# Patient Record
Sex: Male | Born: 1985 | Race: White | Hispanic: No | Marital: Married | Smoking: Current every day smoker
Health system: Southern US, Community
[De-identification: ages and names within clinical notes are randomized; demographics above are authoritative.]

## PROBLEM LIST (undated history)

## (undated) HISTORY — PX: SKULL FRACTURE ELEVATION: SHX781

---
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
## 2006-02-05 ENCOUNTER — Emergency Department (HOSPITAL_COMMUNITY): Admission: EM | Admit: 2006-02-05 | Discharge: 2006-02-06 | Payer: Self-pay | Admitting: Emergency Medicine

## 2006-03-21 ENCOUNTER — Emergency Department (HOSPITAL_COMMUNITY): Admission: EM | Admit: 2006-03-21 | Discharge: 2006-03-21 | Payer: Self-pay | Admitting: Emergency Medicine

## 2006-04-02 ENCOUNTER — Emergency Department (HOSPITAL_COMMUNITY): Admission: EM | Admit: 2006-04-02 | Discharge: 2006-04-02 | Payer: Self-pay | Admitting: Emergency Medicine

## 2006-09-23 ENCOUNTER — Emergency Department (HOSPITAL_COMMUNITY): Admission: EM | Admit: 2006-09-23 | Discharge: 2006-09-23 | Payer: Self-pay | Admitting: Emergency Medicine

## 2008-04-07 ENCOUNTER — Emergency Department (HOSPITAL_COMMUNITY): Admission: EM | Admit: 2008-04-07 | Discharge: 2008-04-07 | Payer: Self-pay | Admitting: Emergency Medicine

## 2008-08-08 ENCOUNTER — Emergency Department (HOSPITAL_COMMUNITY): Admission: EM | Admit: 2008-08-08 | Discharge: 2008-08-08 | Payer: Self-pay | Admitting: Emergency Medicine

## 2010-03-06 ENCOUNTER — Emergency Department (HOSPITAL_COMMUNITY)
Admission: EM | Admit: 2010-03-06 | Discharge: 2010-03-06 | Payer: Self-pay | Source: Home / Self Care | Admitting: Emergency Medicine

## 2010-04-27 ENCOUNTER — Inpatient Hospital Stay (INDEPENDENT_AMBULATORY_CARE_PROVIDER_SITE_OTHER)
Admission: RE | Admit: 2010-04-27 | Discharge: 2010-04-27 | Disposition: A | Discharge status: Home / Self Care | Payer: BC Managed Care – PPO | Source: Ambulatory Visit | Attending: Family Medicine | Admitting: Family Medicine

## 2010-04-27 DIAGNOSIS — T22019A Burn of unspecified degree of unspecified forearm, initial encounter: Secondary | ICD-10-CM

## 2010-07-04 LAB — DIFFERENTIAL
Lymphocytes Relative: 5 % — ABNORMAL LOW (ref 12–46)
Lymphs Abs: 0.6 10*3/uL — ABNORMAL LOW (ref 0.7–4.0)
Monocytes Relative: 5 % (ref 3–12)
Neutrophils Relative %: 90 % — ABNORMAL HIGH (ref 43–77)

## 2010-07-04 LAB — COMPREHENSIVE METABOLIC PANEL
ALT: 20 U/L (ref 0–53)
AST: 24 U/L (ref 0–37)
CO2: 22 mEq/L (ref 19–32)
Calcium: 9.4 mg/dL (ref 8.4–10.5)
Creatinine, Ser: 0.76 mg/dL (ref 0.4–1.5)
GFR calc Af Amer: >60 mL/min (ref >60)
GFR calc non Af Amer: >60 mL/min (ref >60)
Glucose, Bld: 102 mg/dL — ABNORMAL HIGH (ref 70–99)
Sodium: 138 mEq/L (ref 135–145)
Total Protein: 6.8 g/dL (ref 6.0–8.3)

## 2010-07-04 LAB — URINALYSIS, ROUTINE W REFLEX MICROSCOPIC
Glucose, UA: NEGATIVE mg/dL
Hgb urine dipstick: NEGATIVE
pH: 6 (ref 5.0–8.0)

## 2010-07-04 LAB — CBC
MCHC: 33.5 g/dL (ref 30.0–36.0)
MCV: 92.4 fL (ref 78.0–100.0)
RBC: 5.4 MIL/uL (ref 4.22–5.81)
RDW: 13.1 % (ref 11.5–15.5)

## 2012-01-20 ENCOUNTER — Emergency Department (HOSPITAL_COMMUNITY)
Admission: EM | Admit: 2012-01-20 | Discharge: 2012-01-20 | Disposition: A | Discharge status: Home / Self Care | Payer: BC Managed Care – PPO | Attending: Emergency Medicine | Admitting: Emergency Medicine

## 2012-01-20 ENCOUNTER — Encounter (HOSPITAL_COMMUNITY): Payer: Self-pay | Admitting: *Deleted

## 2012-01-20 DIAGNOSIS — F17200 Nicotine dependence, unspecified, uncomplicated: Secondary | ICD-10-CM | POA: Insufficient documentation

## 2012-01-20 DIAGNOSIS — J029 Acute pharyngitis, unspecified: Secondary | ICD-10-CM | POA: Insufficient documentation

## 2012-01-20 LAB — RAPID STREP SCREEN (MED CTR MEBANE ONLY): Streptococcus, Group A Screen (Direct): NEGATIVE

## 2012-01-20 MED ORDER — LIDOCAINE HCL (CARDIAC) 20 MG/ML IV SOLN
INTRAVENOUS | Status: AC
  Filled 2012-01-20: qty 5

## 2012-01-20 MED ORDER — SUCCINYLCHOLINE CHLORIDE 20 MG/ML IJ SOLN
INTRAMUSCULAR | Status: AC
  Filled 2012-01-20: qty 1

## 2012-01-20 MED ORDER — ETOMIDATE 2 MG/ML IV SOLN
INTRAVENOUS | Status: AC
  Filled 2012-01-20: qty 20

## 2012-01-20 MED ORDER — ROCURONIUM BROMIDE 50 MG/5ML IV SOLN
INTRAVENOUS | Status: AC
  Filled 2012-01-20: qty 2

## 2012-01-20 NOTE — ED Provider Notes (Signed)
History     CSN: [id number]  Arrival date & time 01/20/12  1021   First MD Initiated Contact with Patient 01/20/12 1031      Chief Complaint  Patient presents with  . sorethroat     (Consider location/radiation/quality/duration/timing/severity/associated sxs/prior treatment) Patient is a 26 y.o. male presenting with pharyngitis. The history is provided by the patient.  Sore Throat This is a new problem. The current episode started today. The problem occurs constantly. The problem has been gradually worsening. Associated symptoms include a sore throat and swollen glands. Pertinent negatives include no abdominal pain, fever, nausea, rash or vomiting. The symptoms are aggravated by swallowing. He has tried nothing for the symptoms.    History reviewed. No pertinent past medical history.  History reviewed. No pertinent past surgical history.  No family history on file.  History  Substance Use Topics  . Smoking status: Current Every Day Smoker  . Smokeless tobacco: Not on file  . Alcohol Use: Yes     occ      Review of Systems  Constitutional: Negative for fever.  HENT: Positive for sore throat.   Gastrointestinal: Negative for nausea, vomiting and abdominal pain.  Skin: Negative for rash.    Allergies  Review of patient's allergies indicates no known allergies.  Home Medications   Current Outpatient Rx  Name Route Sig Dispense Refill  . GUAIFENESIN-DM 100-10 MG/5ML PO SYRP Oral Take 10 mLs by mouth 3 (three) times daily as needed. For runny nose and cough    . IBUPROFEN 200 MG PO TABS Oral Take 600 mg by mouth every 6 (six) hours as needed. For pain      BP 153/90  Pulse 109  Temp 97.9 F (36.6 C) (Oral)  Resp 22  Ht 5\' 11"  (1.803 m)  Wt 215 lb (97.523 kg)  BMI 29.99 kg/m2  SpO2 98%  Physical Exam  Nursing note and vitals reviewed. Constitutional: He appears well-developed and well-nourished. No distress.  HENT:  Head: Normocephalic and atraumatic. No  trismus in the jaw.  Right Ear: Tympanic membrane and ear canal normal.  Left Ear: Tympanic membrane and ear canal normal.  Nose: Mucosal edema and rhinorrhea present. Right sinus exhibits no maxillary sinus tenderness and no frontal sinus tenderness. Left sinus exhibits no maxillary sinus tenderness and no frontal sinus tenderness.  Mouth/Throat: Uvula is midline and mucous membranes are normal. Posterior oropharyngeal edema and posterior oropharyngeal erythema present. No tonsillar abscesses.  Cardiovascular: Normal rate, regular rhythm and normal heart sounds.   Pulmonary/Chest: Effort normal and breath sounds normal.  Skin: He is not diaphoretic.    ED Course  Procedures (including critical care time)   Labs Reviewed  RAPID STREP SCREEN   No results found.   No diagnosis found.    MDM  Patient presenting with sore throat.  Onset this morning.  Uvula midline.  Patient able to swallow and handling secretions well.  Rapid strep negative.  Patient discharged home.  Return precautions discussed.          Pascal Lux Leando, PA-C 01/20/12 [phone]

## 2012-01-20 NOTE — ED Notes (Signed)
Pt here with sorethroat that started this am and reports stuffiness in nose for last couple of days.

## 2012-01-20 NOTE — ED Notes (Signed)
Lab aware of need to add on strep culture

## 2012-01-21 LAB — STREP A DNA PROBE
Group A Strep Probe: NEGATIVE
Special Requests: NORMAL

## 2012-01-22 NOTE — ED Provider Notes (Signed)
Medical screening examination/treatment/procedure(s) were performed by non-physician practitioner and as supervising physician I was immediately available for consultation/collaboration.   Suzi Roots, MD 01/22/12 [phone]

## 2012-09-30 ENCOUNTER — Emergency Department (HOSPITAL_COMMUNITY)
Admission: EM | Admit: 2012-09-30 | Discharge: 2012-09-30 | Disposition: A | Discharge status: Home / Self Care | Payer: BC Managed Care – PPO | Attending: Emergency Medicine | Admitting: Emergency Medicine

## 2012-09-30 ENCOUNTER — Encounter (HOSPITAL_COMMUNITY): Payer: Self-pay | Admitting: *Deleted

## 2012-09-30 DIAGNOSIS — T6391XA Toxic effect of contact with unspecified venomous animal, accidental (unintentional), initial encounter: Secondary | ICD-10-CM | POA: Insufficient documentation

## 2012-09-30 DIAGNOSIS — T63461A Toxic effect of venom of wasps, accidental (unintentional), initial encounter: Secondary | ICD-10-CM | POA: Insufficient documentation

## 2012-09-30 DIAGNOSIS — Y92838 Other recreation area as the place of occurrence of the external cause: Secondary | ICD-10-CM | POA: Insufficient documentation

## 2012-09-30 DIAGNOSIS — S40862A Insect bite (nonvenomous) of left upper arm, initial encounter: Secondary | ICD-10-CM

## 2012-09-30 DIAGNOSIS — W57XXXA Bitten or stung by nonvenomous insect and other nonvenomous arthropods, initial encounter: Secondary | ICD-10-CM

## 2012-09-30 DIAGNOSIS — Y9239 Other specified sports and athletic area as the place of occurrence of the external cause: Secondary | ICD-10-CM | POA: Insufficient documentation

## 2012-09-30 DIAGNOSIS — L089 Local infection of the skin and subcutaneous tissue, unspecified: Secondary | ICD-10-CM

## 2012-09-30 DIAGNOSIS — F17200 Nicotine dependence, unspecified, uncomplicated: Secondary | ICD-10-CM | POA: Insufficient documentation

## 2012-09-30 DIAGNOSIS — Y939 Activity, unspecified: Secondary | ICD-10-CM | POA: Insufficient documentation

## 2012-09-30 DIAGNOSIS — S40269A Insect bite (nonvenomous) of unspecified shoulder, initial encounter: Secondary | ICD-10-CM | POA: Insufficient documentation

## 2012-09-30 MED ORDER — SULFAMETHOXAZOLE-TRIMETHOPRIM 800-160 MG PO TABS: 1.0000 | ORAL_TABLET | Freq: Two times a day (BID) | ORAL | Status: AC

## 2012-09-30 MED ORDER — SULFAMETHOXAZOLE-TMP DS 800-160 MG PO TABS
1.0000 | ORAL_TABLET | Freq: Once | ORAL | Status: AC
  Administered 2012-09-30: 1 via ORAL
  Filled 2012-09-30: qty 1

## 2012-09-30 NOTE — ED Provider Notes (Signed)
History    CSN: [id number] Arrival date & time 09/30/12  3086  First MD Initiated Contact with Patient 09/30/12 2032     Chief Complaint  Patient presents with  . Insect Bite   (Consider location/radiation/quality/duration/timing/severity/associated sxs/prior Treatment) HPI Comments: Francis Clay is a 27 y.o. Male presenting with an insect sting to his right lower leg which [place].  He reports there being a bee hive near and believes he was stung by a bee but did not see the insect.  He has increased pain,  Swelling and redness that has spread into his medial ankle and foot.  He reports there was a "blood blister" at the site of the sting which has burst but has not continued to bleed or drain.  He has had no fevers, chills, nausea or abdominal pain and denies headaches,  Rash, myalgias.  Incidentally he found a tick on his left upper arm while sitting in our waiting room.  He has taken ibuprofen with mild improvement in foot pain and also elevated it last night with no improvement in swelling and pain.     The history is provided by the patient and the spouse.   History reviewed. No pertinent past medical history. History reviewed. No pertinent past surgical history. History reviewed. No pertinent family history. History  Substance Use Topics  . Smoking status: Current Every Day Smoker  . Smokeless tobacco: Not on file  . Alcohol Use: Yes     Comment: occ    Review of Systems  Constitutional: Negative for fever and chills.  HENT: Negative for facial swelling.   Respiratory: Negative for shortness of breath and wheezing.   Skin: Positive for color change and wound.  Neurological: Negative for numbness.    Allergies  Review of patient's allergies indicates no known allergies.  Home Medications   Current Outpatient Rx  Name  Route  Sig  Dispense  Refill  . ibuprofen (ADVIL,MOTRIN) 200 MG tablet   Oral   Take 600  mg by mouth every 6 (six) hours as needed. For pain          BP 141/98  Pulse 84  Temp(Src) 98.4 F (36.9 C) (Oral)  Resp 24  Ht 5\' 11"  (1.803 m)  Wt 215 lb (97.523 kg)  BMI 30 kg/m2  SpO2 100% Physical Exam  Constitutional: He appears well-developed and well-nourished. No distress.  HENT:  Head: Normocephalic.  Neck: Neck supple.  Cardiovascular: Normal rate.   Pulmonary/Chest: Effort normal. He has no wheezes.  Musculoskeletal: Normal range of motion. He exhibits no edema.  Skin: There is erythema.  Small ulceration right medial lower leg with no surrounding edema,  Necrosis, red streaking , drainage or fluctuance.  Mild erythema extending to instep of the right foot,  Subtly across dorsal foot.  No proximal edema, redness or red streaking.   Embedded tick left lateral upper arm which is not yet engorged.    ED Course  FOREIGN BODY REMOVAL Date/Time: 09/30/2012 9:00 PM Performed by: Burgess Amor Authorized by: Burgess Amor Consent: Verbal consent obtained. Risks and benefits: risks, benefits and alternatives were discussed Consent given by: patient Patient identity confirmed: verbally with patient Body area: skin General location: upper extremity Location details: left upper arm Localization method: visualized Removal mechanism: forceps Complexity: simple 1 objects recovered. Objects recovered: tick Post-procedure assessment: foreign body removed Patient tolerance: Patient tolerated the procedure well with no immediate complications.   (  including critical care time)    Labs Reviewed - No data to display No results found. 1. Infected insect bite of ankle, right, initial encounter   2. Tick bite of upper arm, left, initial encounter     MDM  Pt states would be unable to afford doxy.  No sx suggestive of tick infection,  The insect involved with the ankle pain was a sting, stating he was standing near a bee hive when the sting occurred.  Bactrim given for probable  early cellulitis.  He was also encouraged to ice and elevate,  Benadryl for swelling as there is also component of localized reaction as well. Recheck by pcp or return here for worsened sx.   Burgess Amor, PA-C 10/01/12 0230

## 2012-09-30 NOTE — ED Notes (Signed)
Insect bite to rt ankle  And noticed a tick on his lt upper arm after arrival to ER.  Swelling to lt  ankle

## 2012-10-01 NOTE — ED Provider Notes (Signed)
Medical screening examination/treatment/procedure(s) were performed by non-physician practitioner and as supervising physician I was immediately available for consultation/collaboration.   Pebbles Zeiders L Alorah Mcree, MD 10/01/12 2327 

## 2015-03-18 ENCOUNTER — Emergency Department (HOSPITAL_COMMUNITY)
Admission: EM | Admit: 2015-03-18 | Discharge: 2015-03-18 | Disposition: A | Discharge status: Home / Self Care | Payer: Self-pay | Attending: Emergency Medicine | Admitting: Emergency Medicine

## 2015-03-18 ENCOUNTER — Encounter (HOSPITAL_COMMUNITY): Payer: Self-pay | Admitting: *Deleted

## 2015-03-18 DIAGNOSIS — R51 Headache: Secondary | ICD-10-CM | POA: Insufficient documentation

## 2015-03-18 DIAGNOSIS — J020 Streptococcal pharyngitis: Secondary | ICD-10-CM | POA: Insufficient documentation

## 2015-03-18 DIAGNOSIS — F17200 Nicotine dependence, unspecified, uncomplicated: Secondary | ICD-10-CM | POA: Insufficient documentation

## 2015-03-18 LAB — RAPID STREP SCREEN (MED CTR MEBANE ONLY): STREPTOCOCCUS, GROUP A SCREEN (DIRECT): POSITIVE — AB

## 2015-03-18 MED ORDER — IBUPROFEN 100 MG/5ML PO SUSP
400.0000 mg | Freq: Once | ORAL | Status: AC
  Administered 2015-03-18: 400 mg via ORAL
  Filled 2015-03-18: qty 20

## 2015-03-18 MED ORDER — HYDROCODONE-ACETAMINOPHEN 5-325 MG PO TABS: 1.0000 | ORAL_TABLET | ORAL | Status: DC | PRN

## 2015-03-18 MED ORDER — ONDANSETRON 4 MG PO TBDP
4.0000 mg | ORAL_TABLET | Freq: Once | ORAL | Status: AC
  Administered 2015-03-18: 4 mg via ORAL
  Filled 2015-03-18: qty 1

## 2015-03-18 MED ORDER — PENICILLIN G BENZATHINE 1200000 UNIT/2ML IM SUSP
1.2000 10*6.[IU] | Freq: Once | INTRAMUSCULAR | Status: AC
  Administered 2015-03-18: 1.2 10*6.[IU] via INTRAMUSCULAR
  Filled 2015-03-18: qty 2

## 2015-03-18 NOTE — ED Notes (Signed)
Pt c/o sore throat since last night. 

## 2015-03-18 NOTE — Discharge Instructions (Signed)
Your strep test is positive. Please use a mask over the next 4-5 days. It is important that wash her hands frequently, and do not share eating utensils with anyone. You were treated in the emergency department with intramuscular penicillin (Bicillin). Please use extra strength Tylenol, or 600 mg of ibuprofen for mild pain, use Norco for more severe pain. Saltwater gargles maybe helpful as well. Strep Throat Strep throat is an infection of the throat. It is caused by germs. Strep throat spreads from person to person because of coughing, sneezing, or close contact. HOME CARE Medicines  Take over-the-counter and prescription medicines only as told by your doctor.  Take your antibiotic medicine as told by your doctor. Do not stop taking the medicine even if you feel better.  Have family members who also have a sore throat or fever go to a doctor. Eating and Drinking  Do not share food, drinking cups, or personal items.  Try eating soft foods until your sore throat feels better.  Drink enough fluid to keep your pee (urine) clear or pale yellow. General Instructions  Rinse your mouth (gargle) with a salt-water mixture 3-4 times per day or as needed. To make a salt-water mixture, stir -1 tsp of salt into 1 cup of warm water.  Make sure that all people in your house wash their hands well.  Rest.  Stay home from school or work until you have been taking antibiotics for 24 hours.  Keep all follow-up visits as told by your doctor. This is important. GET HELP IF:  Your neck keeps getting bigger.  You get a rash, cough, or earache.  You cough up thick liquid that is green, yellow-brown, or bloody.  You have pain that does not get better with medicine.  Your problems get worse instead of getting better.  You have a fever. GET HELP RIGHT AWAY IF:  You throw up (vomit).  You get a very bad headache.  You neck hurts or it feels stiff.  You have chest pain or you are short of  breath.  You have drooling, very bad throat pain, or changes in your voice.  Your neck is swollen or the skin gets red and tender.  Your mouth is dry or you are peeing less than normal.  You keep feeling more tired or it is hard to wake up.  Your joints are red or they hurt.   This information is not intended to replace advice given to you by your health care provider. Make sure you discuss any questions you have with your health care provider.   Document Released: 08/23/2007 Document Revised: 11/25/2014 Document Reviewed: 06/29/2014 Elsevier Interactive Patient Education [employer]2016 [employer]vier Inc.

## 2015-03-18 NOTE — ED Provider Notes (Signed)
CSN: [id number]     Arrival date & time 03/18/15  1926 History   First MD Initiated Contact with Patient 03/18/15 2151     Chief Complaint  Patient presents with  . Sore Throat     (Consider location/radiation/quality/duration/timing/severity/associated sxs/prior Treatment) Patient is a 29 y.o. male presenting with pharyngitis. The history is provided by the patient.  Sore Throat This is a new problem. The current episode started yesterday. The problem occurs constantly. The problem has been gradually worsening. Associated symptoms include a fever, headaches, myalgias and a sore throat. Pertinent negatives include no rash. The symptoms are aggravated by swallowing. He has tried nothing for the symptoms. The treatment provided no relief.    History reviewed. No pertinent past medical history. History reviewed. No pertinent past surgical history. History reviewed. No pertinent family history. Social History  Substance Use Topics  . Smoking status: Current Every Day Smoker  . Smokeless tobacco: None  . Alcohol Use: Yes     Comment: occ    Review of Systems  Constitutional: Positive for fever.  HENT: Positive for sore throat.   Musculoskeletal: Positive for myalgias.  Skin: Negative for rash.  Neurological: Positive for headaches.  All other systems reviewed and are negative.     Allergies  Review of patient's allergies indicates no known allergies.  Home Medications   Prior to Admission medications   Medication Sig Start Date End Date Taking? Authorizing Provider  ibuprofen (ADVIL,MOTRIN) 200 MG tablet Take 600 mg by mouth every 6 (six) hours as needed. For pain   Yes Historical Provider, MD  loratadine (ALLERGY) 10 MG tablet Take 10 mg by mouth daily as needed for allergies.   Yes Historical Provider, MD   BP 154/101 mmHg  Pulse 98  Temp(Src) 100.1 F (37.8 C)  Resp 20  Ht 5\' 11"  (1.803 m)  Wt 113.399 kg  BMI 34.88 kg/m2  SpO2 98% Physical Exam  Constitutional:  He is oriented to person, place, and time. He appears well-developed and well-nourished.  Non-toxic appearance.  HENT:  Head: Normocephalic.  Right Ear: Tympanic membrane and external ear normal.  Left Ear: Tympanic membrane and external ear normal.  Mouth/Throat: Uvula swelling present. Oropharyngeal exudate and posterior oropharyngeal erythema present.  Eyes: EOM and lids are normal. Pupils are equal, round, and reactive to light.  Neck: Normal range of motion. Neck supple. Carotid bruit is not present.  Cardiovascular: Normal rate, regular rhythm, normal heart sounds, intact distal pulses and normal pulses.   Pulmonary/Chest: Breath sounds normal. No respiratory distress.  Abdominal: Soft. Bowel sounds are normal. There is no tenderness. There is no guarding.  Musculoskeletal: Normal range of motion.  Lymphadenopathy:       Head (right side): No submandibular adenopathy present.       Head (left side): No submandibular adenopathy present.    He has no cervical adenopathy.  Neurological: He is alert and oriented to person, place, and time. He has normal strength. No cranial nerve deficit or sensory deficit.  Skin: Skin is warm and dry. No rash noted.  Psychiatric: He has a normal mood and affect. His speech is normal.  Nursing note and vitals reviewed.   ED Course  Procedures (including critical care time) Labs Review Labs Reviewed  RAPID STREP SCREEN (NOT AT [facility]) - Abnormal; Notable for the following:    Streptococcus, Group A Screen (Direct) POSITIVE (*)    All other components within normal limits    Imaging Review No results found. I  have personally reviewed and evaluated these images and lab results as part of my medical decision-making.   EKG Interpretation None      MDM  Vital signs reviewed. Temperature 100.1, blood pressure 154/101 elevated. Pulse oximetry is 98% on room air. Rapid strep is positive. Discussed treatment options with the patient, he elects to have  the Bicillin injection.  Discussed with the patient the need to use his mask and wash hands frequently. Discussed the contagious nature of this illness. Patient is to use salt water gargles. Tylenol or ibuprofen for mild pain, prescription for Norco given for more severe pain. Patient is in agreement with this discharge plan.    Final diagnoses:  Strep pharyngitis    **I have reviewed nursing notes, vital signs, and all appropriate lab and imaging results for this patient.Ivery Quale, PA-C 03/19/15 1552  Bethann Berkshire, MD 03/22/15 [phone]

## 2017-06-22 ENCOUNTER — Emergency Department (HOSPITAL_COMMUNITY): Payer: 59

## 2017-06-22 ENCOUNTER — Encounter (HOSPITAL_COMMUNITY): Payer: Self-pay

## 2017-06-22 ENCOUNTER — Emergency Department (HOSPITAL_COMMUNITY)
Admission: EM | Admit: 2017-06-22 | Discharge: 2017-06-22 | Disposition: A | Discharge status: Home / Self Care | Payer: 59 | Attending: Emergency Medicine | Admitting: Emergency Medicine

## 2017-06-22 DIAGNOSIS — Z79899 Other long term (current) drug therapy: Secondary | ICD-10-CM | POA: Insufficient documentation

## 2017-06-22 DIAGNOSIS — R0789 Other chest pain: Secondary | ICD-10-CM

## 2017-06-22 DIAGNOSIS — F17210 Nicotine dependence, cigarettes, uncomplicated: Secondary | ICD-10-CM | POA: Insufficient documentation

## 2017-06-22 MED ORDER — TRAMADOL HCL 50 MG PO TABS: 50.0000 mg | ORAL_TABLET | Freq: Two times a day (BID) | ORAL | 0 refills | Status: DC | PRN

## 2017-06-22 NOTE — ED Notes (Signed)
Pain with palpation left lower ant. Rib area. Reports increase pain in mid back

## 2017-06-22 NOTE — Discharge Instructions (Addendum)
Your chest x-ray did not show any signs of rib fracture or punctured lung. Alternate 600 mg of ibuprofen and [phone] mg of Tylenol every 3 hours as needed for pain. Do not exceed 4000 mg of Tylenol daily.  You may take tramadol as needed for severe pain but do not drive, drink alcohol, or operate heavy machinery while taking this medication as it may make you drowsy.  Use the incentive spirometer 2-3 times daily and cut back on smoking to avoid development of a pneumonia.  Return to the emergency department if any concerning signs or symptoms develop such as fever, productive cough, worsening shortness of breath or chest pain.  Follow-up with your primary care physician for reevaluation of your symptoms.

## 2017-06-22 NOTE — ED Triage Notes (Signed)
Pt reports that he slipped in the tub and landed across tub . Reports pain to left rib area and pain in mid back. Hurts to cough and deep breath

## 2017-06-22 NOTE — ED Provider Notes (Signed)
[facility] EMERGENCY DEPARTMENT Provider Note   CSN: [id number] Arrival date & time: 06/22/17  1002     History   Chief Complaint Chief Complaint  Patient presents with  . Rib Injury    HPI Francis Clay is a 32 y.o. male presents today for evaluation of acute onset, waxing and waning chest wall pain secondary to injury yesterday.  Patient states that yesterday evening he slipped in his bathtub after a shower in the left side of his chest struck the edge of the bathtub.  He denies head injury or loss of consciousness.  He notes a dull ache at rest with sharp pain with cough and certain movements as well as deep inspiration.  He states the pain is focal anteriorly and posteriorly.  He feels short of breath with increasing pain.  He does smoke 2-3 cigars daily and uses a vapor pen.  He notes occasional marijuana use as well.  He denies fevers or productive cough.  The history is provided by the patient.    History reviewed. No pertinent past medical history.  There are no active problems to display for this patient.   History reviewed. No pertinent surgical history.      Home Medications    Prior to Admission medications   Medication Sig Start Date End Date Taking? Authorizing Provider  HYDROcodone-acetaminophen (NORCO/VICODIN) 5-325 MG tablet Take 1 tablet by mouth every 4 (four) hours as needed. Patient not taking: Reported on 06/22/2017 03/18/15   Ivery QualeBryant, [place], PA-C  traMADol (ULTRAM) 50 MG tablet Take 1 tablet (50 mg total) by mouth every 12 (twelve) hours as needed for severe pain. 06/22/17   Jeanie SewerFawze, Emmaly Leech A, PA-C    Family History No family history on file.  Social History Social History   Tobacco Use  . Smoking status: Current Every Day Smoker    Types: E-cigarettes  . Tobacco comment: pt vapes  Substance Use Topics  . Alcohol use: Yes    Comment: daily  . Drug use: Yes    Types: Marijuana     Allergies   Patient has no known allergies.   Review of  Systems Review of Systems  Constitutional: Negative for chills and fever.  Respiratory: Positive for shortness of breath.   Cardiovascular: Positive for chest pain (chest wall).  Gastrointestinal: Negative for abdominal pain, nausea and vomiting.  Neurological: Negative for syncope, weakness, numbness and headaches.  All other systems reviewed and are negative.    Physical Exam Updated Vital Signs BP (!) 139/98 (BP Location: Right Arm)   Pulse 78   Temp 98.4 F (36.9 C) (Oral)   Resp 18   Wt 97.5 kg (215 lb)   SpO2 99%   BMI 29.99 kg/m   Physical Exam  Constitutional: He is oriented to person, place, and time. He appears well-developed and well-nourished. No distress.  HENT:  Head: Normocephalic and atraumatic.  Eyes: Pupils are equal, round, and reactive to light. Conjunctivae and EOM are normal. Right eye exhibits no discharge. Left eye exhibits no discharge.  Neck: Normal range of motion. Neck supple. No JVD present. No tracheal deviation present.  No midline spine TTP, no paraspinal muscle tenderness, no deformity, crepitus, or step-off noted   Cardiovascular: Normal rate, regular rhythm, normal heart sounds and intact distal pulses.  Pulmonary/Chest: Effort normal. No stridor. No respiratory distress. He has no wheezes. He has no rales. He exhibits tenderness.  Focal tenderness to palpation of the chest wall anterior laterally on the left side  and posteriorly along the midclavicular line.  No crepitus, ecchymosis, flail segment or paradoxical wall motion noted.  There is equal rise and fall of chest although the patient is hesitant to take deep breaths secondary to pain.  No increased work of breathing noted and he is speaking in full sentences without difficulty.  Breath sounds are clear to auscultation bilaterally.    Abdominal: Soft. Bowel sounds are normal. He exhibits no distension. There is no tenderness.  Musculoskeletal: Normal range of motion. He exhibits no edema.         Arms: No midline spine tenderness to palpation, parathoracic bony tenderness as documented in the pulmonary section.  No deformity, crepitus, or step-off noted.  Neurological: He is alert and oriented to person, place, and time. No cranial nerve deficit or sensory deficit. He exhibits normal muscle tone.  Skin: Skin is warm and dry. No erythema.  Psychiatric: He has a normal mood and affect. His behavior is normal.  Nursing note and vitals reviewed.    ED Treatments / Results  Labs (all labs ordered are listed, but only abnormal results are displayed) Labs Reviewed - No data to display  EKG None  Radiology Dg Chest 2 View  Result Date: 06/22/2017 CLINICAL DATA:  Pain following fall EXAM: CHEST - 2 VIEW COMPARISON:  September 23, 2006 FINDINGS: Lungs are clear. Heart size and pulmonary vascularity are normal. No adenopathy. No pneumothorax. No fracture evident. IMPRESSION: No abnormality noted. Electronically Signed   By: Bretta Bang III M.D.   On: 06/22/2017 11:16    Procedures Procedures (including critical care time)  Medications Ordered in ED Medications - No data to display   Initial Impression / Assessment and Plan / ED Course  I have reviewed the triage vital signs and the nursing notes.  Pertinent labs & imaging results that were available during my care of the patient were reviewed by me and considered in my medical decision making (see chart for details).     Patient presents with chest wall pain reproducible on palpation secondary to mechanical injury yesterday.  He is afebrile, vital signs are stable.  No head injury or loss of consciousness.  Symptoms do not appear to be cardiac in etiology.  Lungs are clear to auscultation bilaterally although patient is hesitant to take deep breaths secondary to pain.  I doubt pneumothorax/hemothorax.  No flail chest on examination.  Radiographs of the chest show no evidence of acute cardiopulmonary abnormality or rib  fracture.  No evidence of hemothorax or pneumothorax on imaging.  However with significant pain and significant smoking history, we will discharge with an incentive spirometer and a small amount of pain medicine for breakthrough pain.  Discussed pain control with ibuprofen and Tylenol.  Recommend follow-up with primary care physician for reevaluation of symptoms.  Discussed indications for return to the ED. Pt verbalized understanding of and agreement with plan and is safe for discharge home at this time.  Final Clinical Impressions(s) / ED Diagnoses   Final diagnoses:  Chest wall pain    ED Discharge Orders        Ordered    traMADol (ULTRAM) 50 MG tablet  Every 12 hours PRN     06/22/17 1131       Jeanie Sewer, PA-C 06/22/17 1226    Raeford Razor, MD 06/25/17 [phone]

## 2018-09-28 ENCOUNTER — Encounter (HOSPITAL_COMMUNITY): Payer: Self-pay | Admitting: Emergency Medicine

## 2018-09-28 ENCOUNTER — Other Ambulatory Visit: Payer: Self-pay

## 2018-09-28 ENCOUNTER — Emergency Department (HOSPITAL_COMMUNITY)
Admission: EM | Admit: 2018-09-28 | Discharge: 2018-09-28 | Disposition: A | Discharge status: Home / Self Care | Payer: 59 | Attending: Emergency Medicine | Admitting: Emergency Medicine

## 2018-09-28 DIAGNOSIS — Z202 Contact with and (suspected) exposure to infections with a predominantly sexual mode of transmission: Secondary | ICD-10-CM | POA: Insufficient documentation

## 2018-09-28 DIAGNOSIS — F17290 Nicotine dependence, other tobacco product, uncomplicated: Secondary | ICD-10-CM | POA: Insufficient documentation

## 2018-09-28 NOTE — ED Provider Notes (Signed)
[facility] Provider Note   CSN: [id number] Arrival date & time: 09/28/18  1115     History   Chief Complaint No chief complaint on file.   HPI Francis Clay is a 33 y.o. male.     Pt reports his wife told him she had exposed him to herpes.  Pt request std evaluation. Pt denies any current symptoms.  No lesions no discharge.  The history is provided by the patient. No language interpreter was used.  Exposure to STD This is a new problem. The current episode started more than 1 week ago. The problem occurs constantly. The problem has not changed since onset.Nothing aggravates the symptoms. Nothing relieves the symptoms. He has tried nothing for the symptoms. The treatment provided no relief.    History reviewed. No pertinent past medical history.  There are no active problems to display for this patient.   Past Surgical History:  Procedure Laterality Date  . SKULL FRACTURE ELEVATION     when pt was kid         Home Medications    Prior to Admission medications   Medication Sig Start Date End Date Taking? Authorizing Provider  HYDROcodone-acetaminophen (NORCO/VICODIN) 5-325 MG tablet Take 1 tablet by mouth every 4 (four) hours as needed. Patient not taking: Reported on 09/28/2018 03/18/15   Lily Kocher, PA-C  traMADol (ULTRAM) 50 MG tablet Take 1 tablet (50 mg total) by mouth every 12 (twelve) hours as needed for severe pain. Patient not taking: Reported on 09/28/2018 06/22/17   Renita Papa, PA-C    Family History No family history on file.  Social History Social History   Tobacco Use  . Smoking status: Current Every Day Smoker    Types: Cigars  . Smokeless tobacco: Never Used  Substance Use Topics  . Alcohol use: Yes    Comment: daily  . Drug use: Yes    Types: Marijuana    Comment: occ     Allergies   Patient has no known allergies.   Review of Systems Review of Systems  All other systems reviewed and are negative.     Physical Exam Updated Vital Signs BP (!) 187/104 (BP Location: Right Arm)   Pulse (!) 108   Temp 98.4 F (36.9 C) (Oral)   Resp 18   Ht 5\' 11"  (1.803 m)   Wt 97.5 kg   SpO2 100%   BMI 29.99 kg/m   Physical Exam Vitals signs and nursing note reviewed.  Constitutional:      Appearance: He is well-developed.  HENT:     Head: Normocephalic.  Neck:     Musculoskeletal: Normal range of motion.  Cardiovascular:     Rate and Rhythm: Normal rate.  Pulmonary:     Effort: Pulmonary effort is normal.  Abdominal:     General: There is no distension.  Musculoskeletal: Normal range of motion.  Neurological:     General: No focal deficit present.     Mental Status: He is alert and oriented to person, place, and time.  Psychiatric:        Mood and Affect: Mood normal.      ED Treatments / Results  Labs (all labs ordered are listed, but only abnormal results are displayed) Labs Reviewed  HIV ANTIBODY (ROUTINE TESTING W REFLEX)  RPR  GC/CHLAMYDIA PROBE AMP ([place]) NOT AT [facility]    EKG None  Radiology No results found.  Procedures Procedures (including critical care time)  Medications  Ordered in ED Medications - No data to display   Initial Impression / Assessment and Plan / ED Course  I have reviewed the triage vital signs and the nursing notes.  Pertinent labs & imaging results that were available during my care of the patient were reviewed by me and considered in my medical decision making (see chart for details).        Labs pending.  Pt counseled on safe sex.   Final Clinical Impressions(s) / ED Diagnoses   Final diagnoses:  STD exposure    ED Discharge Orders    None    An After Visit Summary was printed and given to the patient.    Elson AreasSofia, Khala Tarte K, Cordelia Poche-C 09/28/18 1904    Vanetta MuldersZackowski, Scott, MD 09/29/18 [phone]

## 2018-09-28 NOTE — Discharge Instructions (Signed)
You have test pending for gonorrhea, Chlamydia,  Syphilis and HIV

## 2018-09-28 NOTE — ED Triage Notes (Signed)
Wife just found out she has genital herpes.  Pt reports she cheated on him and wants to be tested.  Denies any s/s.

## 2018-09-29 LAB — RPR: RPR Ser Ql: NONREACTIVE

## 2018-09-29 LAB — HIV ANTIBODY (ROUTINE TESTING W REFLEX): HIV Screen 4th Generation wRfx: NONREACTIVE

## 2018-10-01 LAB — GC/CHLAMYDIA PROBE AMP (~~LOC~~) NOT AT ARMC
Chlamydia: NEGATIVE
Neisseria Gonorrhea: NEGATIVE

## 2019-09-26 ENCOUNTER — Encounter (HOSPITAL_COMMUNITY): Payer: Self-pay

## 2019-09-26 ENCOUNTER — Other Ambulatory Visit: Payer: Self-pay

## 2019-09-26 ENCOUNTER — Emergency Department (HOSPITAL_COMMUNITY)
Admission: EM | Admit: 2019-09-26 | Discharge: 2019-09-26 | Disposition: A | Discharge status: Home / Self Care | Payer: Self-pay | Attending: Emergency Medicine | Admitting: Emergency Medicine

## 2019-09-26 DIAGNOSIS — Z5321 Procedure and treatment not carried out due to patient leaving prior to being seen by health care provider: Secondary | ICD-10-CM | POA: Insufficient documentation

## 2019-09-26 DIAGNOSIS — H5789 Other specified disorders of eye and adnexa: Secondary | ICD-10-CM | POA: Insufficient documentation

## 2019-09-26 DIAGNOSIS — H5712 Ocular pain, left eye: Secondary | ICD-10-CM | POA: Insufficient documentation

## 2019-09-26 NOTE — ED Triage Notes (Signed)
Pt to er, pt states that he woke up yesterday morning and his L eye was swollen and the sclera was red.  Pt reports that his vision is normal, states that the light makes the pain worse, doesn't feel like anything is in his eye.

## 2021-06-05 ENCOUNTER — Emergency Department (HOSPITAL_COMMUNITY)
Admission: EM | Admit: 2021-06-05 | Discharge: 2021-06-05 | Disposition: A | Discharge status: Home / Self Care | Payer: BC Managed Care – PPO | Attending: Emergency Medicine | Admitting: Emergency Medicine

## 2021-06-05 ENCOUNTER — Emergency Department (HOSPITAL_COMMUNITY): Payer: BC Managed Care – PPO

## 2021-06-05 ENCOUNTER — Encounter (HOSPITAL_COMMUNITY): Payer: Self-pay | Admitting: Emergency Medicine

## 2021-06-05 DIAGNOSIS — W06XXXA Fall from bed, initial encounter: Secondary | ICD-10-CM | POA: Diagnosis not present

## 2021-06-05 DIAGNOSIS — S42021A Displaced fracture of shaft of right clavicle, initial encounter for closed fracture: Secondary | ICD-10-CM | POA: Insufficient documentation

## 2021-06-05 DIAGNOSIS — S4991XA Unspecified injury of right shoulder and upper arm, initial encounter: Secondary | ICD-10-CM | POA: Diagnosis not present

## 2021-06-05 MED ORDER — HYDROCODONE-ACETAMINOPHEN 5-325 MG PO TABS
1.0000 | ORAL_TABLET | Freq: Four times a day (QID) | ORAL | 0 refills | Status: DC | PRN
Start: 2021-06-05 — End: 2021-06-08

## 2021-06-05 MED ORDER — IBUPROFEN 400 MG PO TABS
400.0000 mg | ORAL_TABLET | Freq: Once | ORAL | Status: AC
  Administered 2021-06-05: 400 mg via ORAL
  Filled 2021-06-05: qty 1

## 2021-06-05 MED ORDER — HYDROCODONE-ACETAMINOPHEN 5-325 MG PO TABS
1.0000 | ORAL_TABLET | Freq: Once | ORAL | Status: AC
  Administered 2021-06-05: 1 via ORAL
  Filled 2021-06-05: qty 1

## 2021-06-05 NOTE — ED Triage Notes (Signed)
Pt fell off side of bed about 20 mins PTA. Pt landed on right shoulder. Pt with possible deformity to right collar bone.  ?

## 2021-06-05 NOTE — ED Provider Notes (Signed)
?[facility] ?Provider Note ? ? ?CSN: [id number] ?Arrival date & time: 06/05/21  0306 ? ?  ? ?History ? ?Chief Complaint  ?Patient presents with  ? Shoulder Injury  ? ? ?Francis Clay is a 36 y.o. male. ? ?The history is provided by the patient and a significant other.  ?Shoulder Injury ?This is a new problem. The current episode started less than 1 hour ago. The problem occurs constantly. The problem has been gradually worsening. Pertinent negatives include no chest pain and no headaches. Exacerbated by: movement. The symptoms are relieved by rest.  ?Patient presents with right shoulder pain and right clavicle pain ?Patient reports he rolled out of bed landing on his back and had pain in his clavicle.  No head injury.  No neck pain.  No other acute complaints except for small abrasion to his left little toe ? ?Home Medications ?Prior to Admission medications   ?Medication Sig Start Date End Date Taking? Authorizing Provider  ?HYDROcodone-acetaminophen (NORCO/VICODIN) 5-325 MG tablet Take 1 tablet by mouth every 6 (six) hours as needed for severe pain. 06/05/21  Yes Ripley Fraise, MD  ?   ? ?Allergies    ?Patient has no known allergies.   ? ?Review of Systems   ?Review of Systems  ?Cardiovascular:  Negative for chest pain.  ?Musculoskeletal:  Positive for arthralgias.  ?Skin:  Positive for wound.  ?Neurological:  Negative for weakness, numbness and headaches.  ? ?Physical Exam ?Updated Vital Signs ?BP 131/90 (BP Location: Left Arm)   Pulse 99   Temp 98.1 ?F (36.7 ?C) (Oral)   Resp 18   Ht 1.803 m ($Remove'5\' 11"'dYmBsbc$ )   Wt 99.8 kg   SpO2 97%   BMI 30.68 kg/m?  ?Physical Exam ?CONSTITUTIONAL: Disheveled and anxious ?HEAD: Normocephalic/atraumatic ?ENMT: Mucous membranes moist ?NECK: supple no meningeal signs ?SPINE/BACK:entire spine nontender, nexus criteria met ?NEURO: Pt is awake/alert/appropriate, moves all extremitiesx4.  No facial droop.  GCS 15 ?EXTREMITIES: pulses normal/equal, full  ROM ?Tenderness over the right clavicle with deformity.  No tenderness noted right shoulder.  Distal pulses intact.  No tenderness noted to right elbow or right wrist.  Patient is able to range the right shoulder but limited due to pain. ?SKIN: warm, color normal ?PSYCH: Anxious ? ?ED Results / Procedures / Treatments   ?Labs ?(all labs ordered are listed, but only abnormal results are displayed) ?Labs Reviewed - No data to display ? ?EKG ?None ? ?Radiology ?DG Shoulder Right ? ?Result Date: 06/05/2021 ?CLINICAL DATA:  36 year old male status post fall landing on right shoulder with collarbone deformity. EXAM: RIGHT SHOULDER - 2+ VIEW COMPARISON:  Chest radiographs 06/22/2017. FINDINGS: Comminuted right mid clavicular shaft fracture with an steeply inferiorly angulated 2.4 cm butterfly fragment, larger but more mildly angulated 4.2 cm superior butterfly fragment. But no significant distal fragment displacement or angulation. Right AC joint appears to remain intact. No glenohumeral joint dislocation. Proximal right humerus and right scapula appear intact. Negative visible right ribs and chest. IMPRESSION: Comminuted right clavicle midshaft fracture with displaced, angulated 2 cm and 4 cm butterfly fragments. Electronically Signed   By: Genevie Ann M.D.   On: 06/05/2021 04:47   ? ?Procedures ?[facility] KitchenOrtho Injury Treatment ? ?Date/Time: 06/05/2021 5:15 AM ?Performed by: Ripley Fraise, MD ?Authorized by: Ripley Fraise, MD  ? ?Consent:  ?  Consent obtained:  Verbal ?  Consent given by:  PatientInjury location: sternoclavicular ?Location details: right clavicle ?Injury type: fracture ?Pre-procedure neurovascular assessment: neurovascularly intact ?Pre-procedure distal perfusion:  normal ?Pre-procedure neurological function: normal ?Pre-procedure range of motion: reduced ?Immobilization: sling ?Splint Applied by: ED Nurse ?Post-procedure neurovascular assessment: post-procedure neurovascularly intact ?Post-procedure distal  perfusion: normal ?Post-procedure neurological function: normal ?Post-procedure range of motion: unchanged ? ?  ? ? ?Medications Ordered in ED ?Medications  ?HYDROcodone-acetaminophen (NORCO/VICODIN) 5-325 MG per tablet 1 tablet (has no administration in time range)  ?ibuprofen (ADVIL) tablet 400 mg (400 mg Oral Given 06/05/21 0435)  ? ? ?ED Course/ Medical Decision Making/ A&P ?  ?            NEXUS Criteria Score: 0 ?           ?Medical Decision Making ?Amount and/or Complexity of Data Reviewed ?Radiology: ordered. ? ?Risk ?Prescription drug management. ? ? ?Patient presents after falling out of bed & injuring his right clavicle.  Patient has comminuted right clavicle fracture.  There is no signs of any fracture dislocation to the right shoulder.  No other signs of acute traumatic injury. ?Patient presented with acute injury that received treatment and is now appropriate for discharge home. ?Sling was applied.  Short course of pain medicine provided.  Will refer to Ortho and he will need close follow-up so he can plan his work schedule ? ? ? ? ? ? ? ?Final Clinical Impression(s) / ED Diagnoses ?Final diagnoses:  ?Closed displaced fracture of shaft of right clavicle, initial encounter  ? ? ?Rx / DC Orders ?ED Discharge Orders   ? ?      Ordered  ?  HYDROcodone-acetaminophen (NORCO/VICODIN) 5-325 MG tablet  Every 6 hours PRN       ? 06/05/21 0513  ? ?  ?  ? ?  ? ? ?  ?Ripley Fraise, MD ?06/05/21 [phone] ? ?

## 2021-06-08 ENCOUNTER — Other Ambulatory Visit: Payer: Self-pay

## 2021-06-08 ENCOUNTER — Encounter: Payer: Self-pay | Admitting: Orthopedic Surgery

## 2021-06-08 ENCOUNTER — Ambulatory Visit (INDEPENDENT_AMBULATORY_CARE_PROVIDER_SITE_OTHER): Payer: BC Managed Care – PPO | Admitting: Orthopedic Surgery

## 2021-06-08 ENCOUNTER — Ambulatory Visit: Payer: PRIVATE HEALTH INSURANCE

## 2021-06-08 VITALS — Ht 71.0 in | Wt 224.0 lb

## 2021-06-08 DIAGNOSIS — S42021A Displaced fracture of shaft of right clavicle, initial encounter for closed fracture: Secondary | ICD-10-CM

## 2021-06-08 MED ORDER — HYDROCODONE-ACETAMINOPHEN 5-325 MG PO TABS: 1.0000 | ORAL_TABLET | Freq: Four times a day (QID) | ORAL | 0 refills | Status: DC | PRN

## 2021-06-08 MED ORDER — CYCLOBENZAPRINE HCL 10 MG PO TABS: 10.0000 mg | ORAL_TABLET | Freq: Two times a day (BID) | ORAL | 0 refills | Status: DC | PRN

## 2021-06-08 NOTE — Patient Instructions (Signed)
Out of work until next visit - please provide a note 

## 2021-06-09 ENCOUNTER — Encounter: Payer: Self-pay | Admitting: Orthopedic Surgery

## 2021-06-09 NOTE — Progress Notes (Addendum)
New Patient Visit ? ?Assessment: ?Francis Clay is a 36 y.o. male with the following: ?1. Closed displaced fracture of shaft of right clavicle, initial encounter ? ? ?Plan: ?Francis Clay has a comminuted, minimally displaced fracture of the right clavicle.  After thorough discussion regarding risks and benefits of both operative and nonoperative management of this injury, he would like to try nonoperative management.  As a result, I recommend close follow-up.  I would like to see him back in clinic in approximately 1 week for repeat evaluation.  Continue with the sling at all times.  Okay to remove for hygiene.  Continue with medications as needed.  Provided a letter for work. ? ? ?Follow-up: ?Return in about 9 days (around 06/17/2021). ? ?Subjective: ? ?Chief Complaint  ?Patient presents with  ? Fracture  ?  Rt clavicle DOI 06/05/21  ? ? ?History of Present Illness: ?Francis Clay is a 36 y.o. male who presents for evaluation of a right shoulder injury.  Patient states that he fell out of bed, early in the morning prior to going to the emergency department.  X-rays in the emergency department demonstrated a fracture of the clavicle shaft.  He is placed in a sling.  He was given pain medications.  His pain has been controlled.  No numbness or tingling.  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 ? ? ?Review of Systems: ?No fevers or chills ?No numbness or tingling ?No chest pain ?No shortness of breath ?No bowel or bladder dysfunction ?No GI distress ?No headaches ? ? ?Medical History: ? ?No past medical history on file. ? ?Past Surgical History:  ?Procedure Laterality Date  ? SKULL FRACTURE ELEVATION    ? when pt was kid   ? ? ?No family history on file. ?Social History  ? ?Tobacco Use  ? Smoking status: Every Day  ?  Types: Cigars  ? Smokeless tobacco: Never  ?Vaping Use  ? Vaping Use: Former  ?Substance Use Topics  ? Alcohol use: Yes  ? Drug use: Not Currently  ? ? ?No Known  Allergies ? ?Current Meds  ?Medication Sig  ? cyclobenzaprine (FLEXERIL) 10 MG tablet Take 1 tablet (10 mg total) by mouth 2 (two) times daily as needed for muscle spasms.  ? HYDROcodone-acetaminophen (NORCO/VICODIN) 5-325 MG tablet Take 1 tablet by mouth every 6 (six) hours as needed for moderate pain.  ? ? ?Objective: ?Ht 5\' 11"  (1.803 m)   Wt 224 lb (101.6 kg)   BMI 31.24 kg/m?  ? ?Physical Exam: ? ?General: Alert and oriented. and No acute distress. ?Gait: Normal gait. ? ?Right shoulder with bruising and swelling over the midshaft clavicle.  Tenderness to palpation.  Range of motion of the shoulder is deferred.  Sensation is intact over the axillary nerve distribution.  Sensation is intact throughout the right hand.  2+ radial pulse. ? ?IMAGING: ?I personally ordered and reviewed the following images ? ? ?X-rays of the right clavicle were obtained in clinic today.  He has a comminuted, displaced clavicle shaft fracture.  There is minimal shortening.  Shoulder is reduced.  No injury to the [facility] joint. ? ?Impression: Right comminuted clavicle shaft fracture, in acceptable alignment ? ?New Medications:  ?Meds ordered this encounter  ?Medications  ? HYDROcodone-acetaminophen (NORCO/VICODIN) 5-325 MG tablet  ?  Sig: Take 1 tablet by mouth every 6 (six) hours as needed for moderate pain.  ?  Dispense:  20 tablet  ?  Refill:  0  ? cyclobenzaprine (FLEXERIL) 10 MG tablet  ?  Sig: Take 1 tablet (10 mg total) by mouth 2 (two) times daily as needed for muscle spasms.  ?  Dispense:  15 tablet  ?  Refill:  0  ? ? ? ? ?Mordecai Rasmussen, MD ? ?06/09/2021 ?12:03 PM ? ? ?

## 2021-06-17 ENCOUNTER — Ambulatory Visit: Payer: BC Managed Care – PPO

## 2021-06-17 ENCOUNTER — Encounter: Payer: Self-pay | Admitting: Orthopedic Surgery

## 2021-06-17 ENCOUNTER — Ambulatory Visit (INDEPENDENT_AMBULATORY_CARE_PROVIDER_SITE_OTHER): Payer: BC Managed Care – PPO | Admitting: Orthopedic Surgery

## 2021-06-17 DIAGNOSIS — S42021D Displaced fracture of shaft of right clavicle, subsequent encounter for fracture with routine healing: Secondary | ICD-10-CM

## 2021-06-17 MED ORDER — HYDROCODONE-ACETAMINOPHEN 5-325 MG PO TABS: 1.0000 | ORAL_TABLET | Freq: Four times a day (QID) | ORAL | 0 refills | Status: DC | PRN

## 2021-06-17 MED ORDER — CYCLOBENZAPRINE HCL 10 MG PO TABS
10.0000 mg | ORAL_TABLET | Freq: Two times a day (BID) | ORAL | 0 refills | Status: DC | PRN
Start: 2021-06-17 — End: 2022-01-12

## 2021-06-17 NOTE — Patient Instructions (Signed)
Letter for work, out until the next visit  

## 2021-06-17 NOTE — Progress Notes (Addendum)
Return Patient Visit ? ?Assessment: ?Francis Clay is a 36 y.o. male with the following: ?1. Closed displaced fracture of shaft of right clavicle ? ?Plan: ?Francis Clay returns for evaluation of his right clavicle.  Radiographs demonstrate stable alignment.  His pain is improving.  He is continuing to take pain medications, as well as Flexeril.  As long as he is requiring narcotics, he is unable to drive.  Okay to come out of the sling for hygiene, as well as gentle range of motion of the elbow, wrist and hand.  Provided refills on his medications.  Continue nonoperative management.  Follow-up in approximately 2 weeks. ? ? ?Follow-up: ?No follow-ups on file. ? ?Subjective: ? ?Chief Complaint  ?Patient presents with  ? fracture care  ?  RT clavicle ?Closed displaced fracture of shaft of right clavicle ?DOI 06/05/21  ? ? ?History of Present Illness: ?Francis Clay is a 36 y.o. male who returns for evaluation of a right shoulder injury.  Overall, his pain is improving.  He does continue to take narcotics, as well as Flexeril.  He has remained in a sling.  He is noted some swelling in his fingers.  No additional injuries, although he woke himself up recently with sudden movement of his arm, which is painful. ? ?Review of Systems: ?No fevers or chills ?No numbness or tingling ?No chest pain ?No shortness of breath ?No bowel or bladder dysfunction ?No GI distress ?No headaches ? ?Objective: ?There were no vitals taken for this visit. ? ?Physical Exam: ? ?General: Alert and oriented. and No acute distress. ?Gait: Normal gait. ? ?Improved swelling and bruising over the right clavicle.  Tenderness to palpation over the midshaft of the clavicle.  No obvious deformity.  Sensation is intact in the axillary nerve distribution.  Fingers are warm and well-perfused.  Mild swelling of the right hand.  Active motion intact in the AIN/PIN/U nerve distribution.  Sensation is intact throughout the hand. ? ?IMAGING: ?I personally  ordered and reviewed the following images ? ? ?X-rays of the right clavicle were obtained in clinic today.  He has a comminuted, displaced midshaft fracture.  Overall alignment remains unchanged compared to prior x-rays.  AC joint is reduced.  Glenohumeral joint is reduced. ? ?Impression: Right clavicle fracture in stable alignment ? ? ?New Medications:  ?Meds ordered this encounter  ?Medications  ? cyclobenzaprine (FLEXERIL) 10 MG tablet  ?  Sig: Take 1 tablet (10 mg total) by mouth 2 (two) times daily as needed for muscle spasms.  ?  Dispense:  15 tablet  ?  Refill:  0  ? HYDROcodone-acetaminophen (NORCO/VICODIN) 5-325 MG tablet  ?  Sig: Take 1 tablet by mouth every 6 (six) hours as needed for moderate pain.  ?  Dispense:  20 tablet  ?  Refill:  0  ? ? ? ? ?Oliver Barre, MD ? ?06/17/2021 ?8:42 AM ? ? ?

## 2021-07-05 ENCOUNTER — Encounter: Payer: Self-pay | Admitting: Orthopedic Surgery

## 2021-07-05 ENCOUNTER — Ambulatory Visit: Payer: BC Managed Care – PPO

## 2021-07-05 ENCOUNTER — Ambulatory Visit (INDEPENDENT_AMBULATORY_CARE_PROVIDER_SITE_OTHER): Payer: BC Managed Care – PPO | Admitting: Orthopedic Surgery

## 2021-07-05 DIAGNOSIS — S42021D Displaced fracture of shaft of right clavicle, subsequent encounter for fracture with routine healing: Secondary | ICD-10-CM

## 2021-07-05 NOTE — Patient Instructions (Addendum)
Ok to return to work in a week, 07/11/21; please provide a letter ? ? ?Pendulum ? ? ?Stand near a wall or a surface that you can hold onto for balance. ?Bend at the waist and let your left / right arm hang straight down. Use your other arm to support you. Keep your back straight and do not lock your knees. ?Relax your left / right arm and shoulder muscles, and move your hips and your trunk so your left / right arm swings freely. Your arm should swing because of the motion of your body, not because you are using your arm or shoulder muscles. ?Keep moving your hips and trunk so your arm swings in the following directions, as told by your health care provider: ?Side to side. ?Forward and backward. ?In clockwise and counterclockwise circles. ?Continue each motion for 20 seconds, or for as long as told by your health care provider. ?Slowly return to the starting position. ? ?Repeat 10 times. Complete this exercise daily. ? ? ? ?Limited motion for the next 2 weeks.  After that, ok to progress to motion up to shoulder level, gradual increase above shoulder level.  No lifting until next visit.  Ok to remove sling.  Increase activity slowly.   ?

## 2021-07-05 NOTE — Progress Notes (Signed)
Return Patient Visit ? ?Assessment: ?Francis Clay is a 36 y.o. male with the following: ?1. Closed displaced fracture of shaft of right clavicle ? ?Plan: ?Francis Clay returns for evaluation of his right clavicle.  Radiographs are stable.  His pain is improving.  He is no longer taking any pain medications.  He has started to work on some gentle range of motion.  At this point, okay for him to increase his overall activity.  He is to take it slow.  Okay to come out of the sling.  He should start with pendulum exercises, in the next 1-2 weeks, can start working on range of motion to the level of his shoulder.  In 2 weeks, he can start to advance his range of motion above the level of the shoulder.  He should not be lifting yet.  Okay to return to work starting next week.  Follow-up in 4 weeks. ? ? ?Follow-up: ?Return in about 4 weeks (around 08/02/2021). ? ?Subjective: ? ?Chief Complaint  ?Patient presents with  ? fracture care  ?  Closed displaced fracture of shaft of RIGHT clavicle ?DOI 06/05/21  ? ? ?History of Present Illness: ?Francis Clay is a 36 y.o. male who returns for evaluation of a right shoulder injury.  He continues to improve.  He is no longer taking narcotic pain medications.  He tolerates some gentle range of motion of the left shoulder.  He does note some crepitus in the shoulder area.  He does have some tenderness over the anterior shoulder.  He is taking ibuprofen occasionally.  No numbness or tingling. ? ? ? ?Review of Systems: ?No fevers or chills ?No numbness or tingling ?No chest pain ?No shortness of breath ?No bowel or bladder dysfunction ?No GI distress ?No headaches ? ?Objective: ?There were no vitals taken for this visit. ? ?Physical Exam: ? ?General: Alert and oriented. and No acute distress. ?Gait: Normal gait. ? ?Tenderness over the mid clavicle shaft.  Minimal swelling.  Mild deformity.  Tolerates 60 degrees of abduction at his side.  75 degrees of forward flexion.  Some crepitus is  appreciated with forward flexion.  Fingers are warm and well-perfused.  Sensation is intact throughout the hand.  2+ radial pulse. ? ? ?IMAGING: ?I personally ordered and reviewed the following images ? ?X-rays of the right clavicle were obtained in clinic today.  Is a comminuted, midshaft clavicle fracture.  This is unchanged from previous radiographs.  No evidence of callus formation.  No acute injuries are noted.  AC joint is reduced. ? ?Impression: Healing comminuted, midshaft right clavicle fracture. ? ? ?New Medications:  ?No orders of the defined types were placed in this encounter. ? ? ? ? ?Mordecai Rasmussen, MD ? ?07/05/2021 ?11:46 AM ? ? ?

## 2021-08-05 ENCOUNTER — Encounter: Payer: Self-pay | Admitting: Orthopedic Surgery

## 2021-08-05 ENCOUNTER — Ambulatory Visit (INDEPENDENT_AMBULATORY_CARE_PROVIDER_SITE_OTHER): Payer: BC Managed Care – PPO

## 2021-08-05 ENCOUNTER — Ambulatory Visit (INDEPENDENT_AMBULATORY_CARE_PROVIDER_SITE_OTHER): Payer: BC Managed Care – PPO | Admitting: Orthopedic Surgery

## 2021-08-05 VITALS — Ht 71.0 in | Wt 224.0 lb

## 2021-08-05 DIAGNOSIS — S42021D Displaced fracture of shaft of right clavicle, subsequent encounter for fracture with routine healing: Secondary | ICD-10-CM

## 2021-08-06 ENCOUNTER — Encounter: Payer: Self-pay | Admitting: Orthopedic Surgery

## 2021-08-06 NOTE — Progress Notes (Signed)
Return Patient Visit  Assessment: Francis Clay is a 36 y.o. male with the following: 1. Closed displaced fracture of shaft of right clavicle  Plan: [name] is doing much better.  He has minimal pain.  Radiographs are stable, demonstrating interval consolidation of the comminuted right clavicle fracture.  He has near full range of motion of the right shoulder.  He does have some weakness, but anticipate this will continue to improve.  Gradual increase in his activities.  Be cognizant of how much he is lifting.  Medications as needed.  Call with issues.   Follow-up: Return if symptoms worsen or fail to improve.  Subjective:  Chief Complaint  Patient presents with   Fracture    Rt clavicle DOI 06/05/21    History of Present Illness: Francis Clay is a 36 y.o. male who returns for evaluation of a right shoulder injury.  He sustained a comminuted right clavicle fracture approximately 2 months ago.  He has progressively improved.  He is no longer taking medications on a consistent basis.  He has regained range of motion.  He notes some popping and pain with certain activities, but this is getting better.  No numbness or tingling.  He is pleased with his progress.    Review of Systems: No fevers or chills No numbness or tingling No chest pain No shortness of breath No bowel or bladder dysfunction No GI distress No headaches  Objective: Ht 5\' 11"  (1.803 m)   Wt 224 lb (101.6 kg)   BMI 31.24 kg/m   Physical Exam:  General: Alert and oriented. and No acute distress. Gait: Normal gait.  Deformity appreciated over the mid right clavicle.  No tenderness to palpation.  160 degrees of active forward elevation.  Internal rotation of the lumbar spine.  5/5 deltoid strength.  4+/5 supraspinatus and infraspinatus testing.  Sensation of the right hand is intact in all nerve distributions.  2+ radial pulse.  IMAGING: I personally ordered and reviewed the following images  X-rays  of the right clavicle were obtained in clinic today.  These were compared to prior x-rays.  The comminuted midshaft clavicle fracture is still easily identified.  There is obvious callus formation in this area.  No new injuries are noted.  Overall alignment remains stable.  Impression: Healing right comminuted midshaft clavicle fracture   New Medications:  No orders of the defined types were placed in this encounter.     , MD  08/06/2021 12:08 AM

## 2021-10-03 DIAGNOSIS — R0681 Apnea, not elsewhere classified: Secondary | ICD-10-CM | POA: Diagnosis not present

## 2021-10-04 DIAGNOSIS — R0681 Apnea, not elsewhere classified: Secondary | ICD-10-CM | POA: Diagnosis not present

## 2021-10-06 DIAGNOSIS — G4733 Obstructive sleep apnea (adult) (pediatric): Secondary | ICD-10-CM | POA: Diagnosis not present

## 2021-10-20 DIAGNOSIS — G4733 Obstructive sleep apnea (adult) (pediatric): Secondary | ICD-10-CM | POA: Diagnosis not present

## 2021-10-31 DIAGNOSIS — M5412 Radiculopathy, cervical region: Secondary | ICD-10-CM | POA: Diagnosis not present

## 2021-11-30 DIAGNOSIS — G4733 Obstructive sleep apnea (adult) (pediatric): Secondary | ICD-10-CM | POA: Diagnosis not present

## 2021-12-01 DIAGNOSIS — G4733 Obstructive sleep apnea (adult) (pediatric): Secondary | ICD-10-CM | POA: Diagnosis not present

## 2022-01-12 ENCOUNTER — Encounter: Payer: Self-pay | Admitting: Internal Medicine

## 2022-01-12 ENCOUNTER — Ambulatory Visit: Payer: BC Managed Care – PPO | Admitting: Internal Medicine

## 2022-01-12 VITALS — BP 142/92 | HR 96 | Ht 71.0 in | Wt 218.2 lb

## 2022-01-12 DIAGNOSIS — Z0001 Encounter for general adult medical examination with abnormal findings: Secondary | ICD-10-CM

## 2022-01-12 DIAGNOSIS — Z789 Other specified health status: Secondary | ICD-10-CM

## 2022-01-12 DIAGNOSIS — Z2821 Immunization not carried out because of patient refusal: Secondary | ICD-10-CM | POA: Diagnosis not present

## 2022-01-12 DIAGNOSIS — Z7689 Persons encountering health services in other specified circumstances: Secondary | ICD-10-CM

## 2022-01-12 DIAGNOSIS — I10 Essential (primary) hypertension: Secondary | ICD-10-CM | POA: Diagnosis not present

## 2022-01-12 DIAGNOSIS — Z720 Tobacco use: Secondary | ICD-10-CM

## 2022-01-12 DIAGNOSIS — G4733 Obstructive sleep apnea (adult) (pediatric): Secondary | ICD-10-CM | POA: Diagnosis not present

## 2022-01-12 MED ORDER — LISINOPRIL 10 MG PO TABS: 10.0000 mg | ORAL_TABLET | Freq: Every day | ORAL | 1 refills | Status: DC

## 2022-01-12 NOTE — Patient Instructions (Signed)
It was a pleasure to see you today.  Thank you for giving [place] the opportunity to be involved in your care.  Below is a brief recap of your visit and next steps.  We will plan to see you again in 4 weeks.  Summary You have established care I have refilled lisinopril We will check labs You will receive your tetanus shot today  Next steps Follow up in 4 weeks.

## 2022-01-12 NOTE — Progress Notes (Signed)
New Patient Office Visit  Subjective    Patient ID: Francis Clay, male    DOB: March 24, 1985  Age: 36 y.o. MRN: [id number]  CC:  Chief Complaint  Patient presents with   Establish Care    HPI Francis Clay presents to establish care.  He is a 36 year old male who reports a history of hypertension and OSA.  He also has a recent history of a right clavicular fracture.  Francis Clay states that he feels well today.  He has no acute concerns aside from wanting to establish care and requesting a refill of lisinopril 10 mg daily for treatment of hypertension.  He currently smokes 3-4 black and mild cigars daily and drinks 2-3 beers daily.  He denies illicit drug use.  He endorses a family history of hypertension.  Chronic medical conditions and outstanding preventative care items discussed today are individually addressed in A/P below  Outpatient Encounter Medications as of 01/12/2022  Medication Sig   [DISCONTINUED] lisinopril (ZESTRIL) 10 MG tablet Take 10 mg by mouth daily.   lisinopril (ZESTRIL) 10 MG tablet Take 1 tablet (10 mg total) by mouth daily.   [DISCONTINUED] cyclobenzaprine (FLEXERIL) 10 MG tablet Take 1 tablet (10 mg total) by mouth 2 (two) times daily as needed for muscle spasms. (Patient not taking: Reported on 08/05/2021)   [DISCONTINUED] HYDROcodone-acetaminophen (NORCO/VICODIN) 5-325 MG tablet Take 1 tablet by mouth every 6 (six) hours as needed for moderate pain. (Patient not taking: Reported on 08/05/2021)   No facility-administered encounter medications on file as of 01/12/2022.    History reviewed. No pertinent past medical history.  Past Surgical History:  Procedure Laterality Date   SKULL FRACTURE ELEVATION     when pt was kid     History reviewed. No pertinent family history.  Social History   Socioeconomic History   Marital status: Married    Spouse name: Not on file   Number of children: Not on file   Years of education: Not on file   Highest education  level: Not on file  Occupational History   Not on file  Tobacco Use   Smoking status: Every Day    Types: Cigars   Smokeless tobacco: Never  Vaping Use   Vaping Use: Former  Substance and Sexual Activity   Alcohol use: Yes   Drug use: Not Currently   Sexual activity: Not on file  Other Topics Concern   Not on file  Social History Narrative   Not on file   Social Determinants of Health   Financial Resource Strain: Not on file  Food Insecurity: Not on file  Transportation Needs: Not on file  Physical Activity: Not on file  Stress: Not on file  Social Connections: Not on file  Intimate Partner Violence: Not on file    Review of Systems  Constitutional:  Negative for chills and fever.  HENT:  Negative for sore throat.   Respiratory:  Negative for cough and shortness of breath.   Cardiovascular:  Negative for chest pain, palpitations and leg swelling.  Gastrointestinal:  Negative for abdominal pain, blood in stool, constipation, diarrhea, nausea and vomiting.  Genitourinary:  Negative for dysuria and hematuria.  Musculoskeletal:  Negative for myalgias.  Skin:  Negative for itching and rash.  Neurological:  Negative for dizziness and headaches.  Psychiatric/Behavioral:  Negative for depression and suicidal ideas.         Objective    BP (!) 142/92   Pulse 96   Ht 5'  11" (1.803 m)   Wt 218 lb 3.2 oz (99 kg)   SpO2 96%   BMI 30.43 kg/m   Physical Exam Vitals reviewed.  Constitutional:      General: He is not in acute distress.    Appearance: Normal appearance. He is obese. He is not ill-appearing.  HENT:     Head: Normocephalic and atraumatic.     Nose: Nose normal. No congestion or rhinorrhea.     Mouth/Throat:     Mouth: Mucous membranes are moist.     Pharynx: Oropharynx is clear.  Eyes:     Extraocular Movements: Extraocular movements intact.     Conjunctiva/sclera: Conjunctivae normal.     Pupils: Pupils are equal, round, and reactive to light.   Cardiovascular:     Rate and Rhythm: Normal rate and regular rhythm.     Pulses: Normal pulses.     Heart sounds: Normal heart sounds. No murmur heard. Pulmonary:     Effort: Pulmonary effort is normal.     Breath sounds: Normal breath sounds. No wheezing, rhonchi or rales.  Abdominal:     General: Abdomen is flat. Bowel sounds are normal. There is no distension.     Palpations: Abdomen is soft.     Tenderness: There is no abdominal tenderness.  Musculoskeletal:        General: No swelling or deformity. Normal range of motion.     Cervical back: Normal range of motion.  Skin:    General: Skin is warm and dry.     Capillary Refill: Capillary refill takes less than 2 seconds.  Neurological:     General: No focal deficit present.     Mental Status: He is alert and oriented to person, place, and time.     Motor: No weakness.  Psychiatric:        Mood and Affect: Mood normal.        Behavior: Behavior normal.        Thought Content: Thought content normal.    Assessment & Plan:   Problem List Items Addressed This Visit       Essential hypertension    BP 149/92 today.  He has previously been prescribed lisinopril 10 mg daily, but states that he has been out of the medication for the last week.  Refill provided today.  We will follow-up in 4 weeks for BP check.      OSA (obstructive sleep apnea)    He reports a history of OSA and states he wears a mouthguard at night, which is effective for him.  He reports undergoing repeat PSG with mouthguard in place and the rate of apneic events had significantly improved.      Encounter for well adult exam with abnormal findings    Presenting today to establish care. -Baseline labs ordered, including one-time HCV screening -Influenza vaccine declined today, but he plans to receive Tdap at follow-up in 4 weeks      Tobacco use    Currently smokes 3-4 black and mild cigars daily.  He remains precontemplative with regards to cessation.       Alcohol use    Currently drinking 2-3 beers daily.  He is not interested in cessation.      Return in about 4 weeks (around 02/09/2022) for BP check.   Francis Abraham, MD

## 2022-01-18 ENCOUNTER — Encounter: Payer: Self-pay | Admitting: Internal Medicine

## 2022-01-18 DIAGNOSIS — G4733 Obstructive sleep apnea (adult) (pediatric): Secondary | ICD-10-CM | POA: Insufficient documentation

## 2022-01-18 DIAGNOSIS — Z0001 Encounter for general adult medical examination with abnormal findings: Secondary | ICD-10-CM | POA: Insufficient documentation

## 2022-01-18 DIAGNOSIS — Z720 Tobacco use: Secondary | ICD-10-CM | POA: Insufficient documentation

## 2022-01-18 DIAGNOSIS — I10 Essential (primary) hypertension: Secondary | ICD-10-CM | POA: Insufficient documentation

## 2022-01-18 DIAGNOSIS — Z789 Other specified health status: Secondary | ICD-10-CM | POA: Insufficient documentation

## 2022-01-18 NOTE — Assessment & Plan Note (Signed)
Currently drinking 2-3 beers daily.  He is not interested in cessation.

## 2022-01-18 NOTE — Assessment & Plan Note (Signed)
Currently smokes 3-4 black and mild cigars daily.  He remains precontemplative with regards to cessation.

## 2022-01-18 NOTE — Assessment & Plan Note (Signed)
He reports a history of OSA and states he wears a mouthguard at night, which is effective for him.  He reports undergoing repeat PSG with mouthguard in place and the rate of apneic events had significantly improved.

## 2022-01-18 NOTE — Assessment & Plan Note (Signed)
BP 149/92 today.  He has previously been prescribed lisinopril 10 mg daily, but states that he has been out of the medication for the last week.  Refill provided today.  We will follow-up in 4 weeks for BP check.

## 2022-01-18 NOTE — Assessment & Plan Note (Signed)
Presenting today to establish care. -Baseline labs ordered, including one-time HCV screening -Influenza vaccine declined today, but he plans to receive Tdap at follow-up in 4 weeks

## 2022-02-08 ENCOUNTER — Encounter: Payer: Self-pay | Admitting: Internal Medicine

## 2022-02-08 ENCOUNTER — Ambulatory Visit: Payer: BC Managed Care – PPO | Admitting: Internal Medicine

## 2022-02-08 VITALS — BP 142/93 | HR 91 | Ht 71.0 in | Wt 221.4 lb

## 2022-02-08 DIAGNOSIS — Z23 Encounter for immunization: Secondary | ICD-10-CM

## 2022-02-08 DIAGNOSIS — I10 Essential (primary) hypertension: Secondary | ICD-10-CM | POA: Diagnosis not present

## 2022-02-08 DIAGNOSIS — Z7689 Persons encountering health services in other specified circumstances: Secondary | ICD-10-CM | POA: Diagnosis not present

## 2022-02-08 MED ORDER — LISINOPRIL 20 MG PO TABS: 20.0000 mg | ORAL_TABLET | Freq: Every day | ORAL | 1 refills | Status: DC

## 2022-02-08 NOTE — Assessment & Plan Note (Signed)
-   Tdap vaccine administered today.

## 2022-02-08 NOTE — Progress Notes (Signed)
Established Patient Office Visit  Subjective   Patient ID: Francis Clay, male    DOB: 09/26/1985  Age: 36 y.o. MRN: [id number]  Chief Complaint  Patient presents with   Follow-up   [name] returns to care today for HTN follow-up.  He was last seen by me on 10/26 to establish care.  He endorsed a history of hypertension and OSA at that time.  His blood pressure was elevated, 149/92.  I refilled lisinopril 10 mg daily that has been previously prescribed.  4-week follow-up was arranged for BP check.  There have been no acute interval events.  Today [name] states that he feels well.  He is asymptomatic and has no additional concerns to discuss.  History reviewed. No pertinent past medical history. Past Surgical History:  Procedure Laterality Date   SKULL FRACTURE ELEVATION     when pt was kid    Social History   Tobacco Use   Smoking status: Every Day    Types: Cigars   Smokeless tobacco: Never  Vaping Use   Vaping Use: Former  Substance Use Topics   Alcohol use: Yes   Drug use: Not Currently   History reviewed. No pertinent family history. No Known Allergies  Review of Systems  Constitutional:  Negative for chills and fever.  HENT:  Negative for sore throat.   Respiratory:  Negative for cough and shortness of breath.   Cardiovascular:  Negative for chest pain, palpitations and leg swelling.  Gastrointestinal:  Negative for abdominal pain, blood in stool, constipation, diarrhea, nausea and vomiting.  Genitourinary:  Negative for dysuria and hematuria.  Musculoskeletal:  Negative for myalgias.  Skin:  Negative for itching and rash.  Neurological:  Negative for dizziness and headaches.  Psychiatric/Behavioral:  Negative for depression and suicidal ideas.      Objective:     BP (!) 142/93   Pulse 91   Ht 5\' 11"  (1.803 m)   Wt 221 lb 6.4 oz (100.4 kg)   SpO2 96%   BMI 30.88 kg/m  BP Readings from Last 3 Encounters:  02/08/22 (!) 142/93  01/12/22 (!) 142/92   06/05/21 128/79   Physical Exam Vitals reviewed.  Constitutional:      General: He is not in acute distress.    Appearance: Normal appearance. He is obese. He is not ill-appearing.  HENT:     Head: Normocephalic and atraumatic.     Nose: Nose normal. No congestion or rhinorrhea.     Mouth/Throat:     Mouth: Mucous membranes are moist.     Pharynx: Oropharynx is clear.  Eyes:     Extraocular Movements: Extraocular movements intact.     Conjunctiva/sclera: Conjunctivae normal.     Pupils: Pupils are equal, round, and reactive to light.  Cardiovascular:     Rate and Rhythm: Normal rate and regular rhythm.     Pulses: Normal pulses.     Heart sounds: Normal heart sounds. No murmur heard. Pulmonary:     Effort: Pulmonary effort is normal.     Breath sounds: Normal breath sounds. No wheezing, rhonchi or rales.  Abdominal:     General: Abdomen is flat. Bowel sounds are normal. There is no distension.     Palpations: Abdomen is soft.     Tenderness: There is no abdominal tenderness.  Musculoskeletal:        General: No swelling or deformity. Normal range of motion.     Cervical back: Normal range of motion.  Skin:  General: Skin is warm and dry.     Capillary Refill: Capillary refill takes less than 2 seconds.  Neurological:     General: No focal deficit present.     Mental Status: He is alert and oriented to person, place, and time.     Motor: No weakness.  Psychiatric:        Mood and Affect: Mood normal.        Behavior: Behavior normal.        Thought Content: Thought content normal.      Assessment & Plan:   Problem List Items Addressed This Visit     Essential hypertension - Primary    Lisinopril 10 mg daily was refilled at his last appointment after he reported being off of the medication for period of time.  His blood pressure remains elevated today, 142/93. -Increase lisinopril to 20 mg daily -We will plan for follow-up in 4 weeks for BP check via telephone  encounter      Need for Tdap vaccination    Tdap vaccine administered today      Return in about 4 weeks (around 03/08/2022).    [name], MD

## 2022-02-08 NOTE — Assessment & Plan Note (Signed)
Lisinopril 10 mg daily was refilled at his last appointment after he reported being off of the medication for period of time.  His blood pressure remains elevated today, 142/93. -Increase lisinopril to 20 mg daily -We will plan for follow-up in 4 weeks for BP check via telephone encounter

## 2022-02-08 NOTE — Patient Instructions (Signed)
It was a pleasure to see you today.  Thank you for giving [place] the opportunity to be involved in your care.  Below is a brief recap of your visit and next steps.  We will plan to see you again in 4 weeks.  Summary Increase lisinopril to 20 mg daily You will receive your tetanus shot today Phone call appointment in 4 weeks

## 2022-02-09 LAB — CMP14+EGFR
ALT: 37 IU/L (ref 0–44)
AST: 27 IU/L (ref 0–40)
Albumin/Globulin Ratio: 2.2 (ref 1.2–2.2)
Albumin: 4.8 g/dL (ref 4.1–5.1)
Alkaline Phosphatase: 48 IU/L (ref 44–121)
BUN/Creatinine Ratio: 13 (ref 9–20)
BUN: 11 mg/dL (ref 6–20)
Bilirubin Total: 0.3 mg/dL (ref 0.0–1.2)
CO2: 23 mmol/L (ref 20–29)
Calcium: 9.7 mg/dL (ref 8.7–10.2)
Chloride: 100 mmol/L (ref 96–106)
Creatinine, Ser: 0.87 mg/dL (ref 0.76–1.27)
Globulin, Total: 2.2 g/dL (ref 1.5–4.5)
Glucose: 96 mg/dL (ref 70–99)
Potassium: 4.1 mmol/L (ref 3.5–5.2)
Sodium: 138 mmol/L (ref 134–144)
Total Protein: 7 g/dL (ref 6.0–8.5)
eGFR: 115 mL/min/{1.73_m2} (ref >59)

## 2022-02-09 LAB — CBC WITH DIFFERENTIAL/PLATELET
Basophils Absolute: 0.1 10*3/uL (ref 0.0–0.2)
Basos: 1 %
EOS (ABSOLUTE): 0.3 10*3/uL (ref 0.0–0.4)
Eos: 3 %
Hematocrit: 43.8 % (ref 37.5–51.0)
Hemoglobin: 15.1 g/dL (ref 13.0–17.7)
Immature Grans (Abs): 0 10*3/uL (ref 0.0–0.1)
Immature Granulocytes: 0 %
Lymphocytes Absolute: 2.6 10*3/uL (ref 0.7–3.1)
Lymphs: 33 %
MCH: 32.3 pg (ref 26.6–33.0)
MCHC: 34.5 g/dL (ref 31.5–35.7)
MCV: 94 fL (ref 79–97)
Monocytes Absolute: 0.8 10*3/uL (ref 0.1–0.9)
Monocytes: 10 %
Neutrophils Absolute: 4.3 10*3/uL (ref 1.4–7.0)
Neutrophils: 53 %
Platelets: 256 10*3/uL (ref 150–450)
RBC: 4.67 x10E6/uL (ref 4.14–5.80)
RDW: 13.7 % (ref 11.6–15.4)
WBC: 8 10*3/uL (ref 3.4–10.8)

## 2022-02-09 LAB — LIPID PANEL
Chol/HDL Ratio: 4.2 ratio (ref 0.0–5.0)
Cholesterol, Total: 189 mg/dL (ref 100–199)
HDL: 45 mg/dL (ref >39)
LDL Chol Calc (NIH): 112 mg/dL — ABNORMAL HIGH (ref 0–99)
Triglycerides: 183 mg/dL — ABNORMAL HIGH (ref 0–149)
VLDL Cholesterol Cal: 32 mg/dL (ref 5–40)

## 2022-02-09 LAB — HCV AB W REFLEX TO QUANT PCR: HCV Ab: NONREACTIVE

## 2022-02-09 LAB — HEMOGLOBIN A1C
Est. average glucose Bld gHb Est-mCnc: 117 mg/dL
Hgb A1c MFr Bld: 5.7 % — ABNORMAL HIGH (ref 4.8–5.6)

## 2022-02-09 LAB — HCV INTERPRETATION

## 2022-03-08 ENCOUNTER — Ambulatory Visit (INDEPENDENT_AMBULATORY_CARE_PROVIDER_SITE_OTHER): Payer: BC Managed Care – PPO | Admitting: Internal Medicine

## 2022-03-08 ENCOUNTER — Encounter: Payer: Self-pay | Admitting: Internal Medicine

## 2022-03-08 DIAGNOSIS — I10 Essential (primary) hypertension: Secondary | ICD-10-CM | POA: Diagnosis not present

## 2022-03-08 NOTE — Progress Notes (Signed)
   Established Patient Telephone Visit  Virtual Visit via Telephone Note  I connected with Lamonte Sakai on 03/08/22 at  4:00 PM EST by telephone and verified that I am speaking with the correct person using two identifiers.  Location: Patient: [address] [place], [place] [address] Provider: [phone] S. [address] [place], [place] [address]   I discussed the limitations, risks, security and privacy concerns of performing an evaluation and management service by telephone and the availability of in person appointments. I also discussed with the patient that there may be a patient responsible charge related to this service. The patient expressed understanding and agreed to proceed.   History of Present Illness:  Mr. Montellano has been evaluated for HTN follow-up today via telephone encounter.  He was last seen by me on 11/22 at which time lisinopril was increased to 20 mg daily for improved HTN control.  There have been no acute interval events.  Today Mr. Saxer reports feeling well.  He has purchased a home blood pressure machine since his last appointment and reports readings around 130/80.  He is asymptomatic and has no additional concerns to discuss today.  Assessment and Plan:  Essential hypertension Lisinopril was increased to 20 mg daily at his last appointment for improved HTN control.  He has been checking his blood pressure in the interim and reports readings around 130/80. -No additional medication changes today.  Continue lisinopril 20 mg daily -Follow-up in 3 months for HTN  Follow Up Instructions:  I discussed the assessment and treatment plan with the patient. The patient was provided an opportunity to ask questions and all were answered. The patient agreed with the plan and demonstrated an understanding of the instructions.   The patient was advised to call back or seek an in-person evaluation if the symptoms worsen or if the condition fails to improve as anticipated.  I provided 7  minutes of non-face-to-face time during this encounter.   [name], MD

## 2022-05-18 ENCOUNTER — Encounter: Payer: Self-pay | Admitting: Radiology

## 2022-06-07 ENCOUNTER — Ambulatory Visit: Payer: BC Managed Care – PPO | Admitting: Internal Medicine

## 2022-06-07 ENCOUNTER — Encounter: Payer: Self-pay | Admitting: Internal Medicine

## 2022-06-07 VITALS — BP 134/72 | HR 101 | Ht 71.0 in | Wt 228.8 lb

## 2022-06-07 DIAGNOSIS — Z720 Tobacco use: Secondary | ICD-10-CM | POA: Diagnosis not present

## 2022-06-07 DIAGNOSIS — I10 Essential (primary) hypertension: Secondary | ICD-10-CM

## 2022-06-07 DIAGNOSIS — E782 Mixed hyperlipidemia: Secondary | ICD-10-CM

## 2022-06-07 DIAGNOSIS — R7303 Prediabetes: Secondary | ICD-10-CM

## 2022-06-07 DIAGNOSIS — E785 Hyperlipidemia, unspecified: Secondary | ICD-10-CM | POA: Insufficient documentation

## 2022-06-07 NOTE — Patient Instructions (Signed)
It was a pleasure to see you today.  Thank you for giving us the opportunity to be involved in your care.  Below is a brief recap of your visit and next steps.  We will plan to see you again in 6 months.  Summary No medication changes today We will plan for follow up in 6 months  

## 2022-06-07 NOTE — Assessment & Plan Note (Signed)
Continues to smoke 3-4 black and mild cigars daily and remains precontemplative with regards to cessation. -The patient was counseled on the dangers of tobacco use, and was advised to quit and reluctant to quit.  Reviewed strategies to maximize success, including removing cigarettes and smoking materials from environment, stress management, substitution of other forms of reinforcement, support of family/friends, and written materials.

## 2022-06-07 NOTE — Assessment & Plan Note (Addendum)
Currently prescribed lisinopril 20 mg daily.  His blood pressure today is 134/72.  He has been checking his blood pressure regularly at home and reports readings consistently 120s over 80s. -No medication changes today

## 2022-06-07 NOTE — Assessment & Plan Note (Signed)
A1c 5.7 in November 2023.  He has been counseled on lifestyle modifications aimed at weight loss and lowering his average blood sugar.

## 2022-06-07 NOTE — Assessment & Plan Note (Signed)
Lipid panel updated in November 2023.  Total cholesterol 189 and LDL 112. -Counseled on lifestyle modifications aimed at weight loss and improving his cholesterol panel.  Mediterranean diet recommended.

## 2022-06-07 NOTE — Progress Notes (Signed)
Established Patient Office Visit  Subjective   Patient ID: Francis Clay, male    DOB: [birth year]  Age: 37 y.o. MRN: [id number]  Chief Complaint  Patient presents with   Hypertension    Follow up   Francis Clay returns to care today for follow-up.  He was last evaluated by me through telephone encounter on 03/08/22 for HTN follow-up.  There have been no acute interval events.  Francis Clay reports feeling well today.  He is asymptomatic and has no additional concerns to discuss.  History reviewed. No pertinent past medical history. Past Surgical History:  Procedure Laterality Date   SKULL FRACTURE ELEVATION     when pt was kid    Social History   Tobacco Use   Smoking status: Every Day    Types: Cigars   Smokeless tobacco: Never  Vaping Use   Vaping Use: Former  Substance Use Topics   Alcohol use: Yes   Drug use: Not Currently   History reviewed. No pertinent family history. No Known Allergies  Review of Systems  Constitutional:  Negative for chills and fever.  HENT:  Negative for sore throat.   Respiratory:  Negative for cough and shortness of breath.   Cardiovascular:  Negative for chest pain, palpitations and leg swelling.  Gastrointestinal:  Negative for abdominal pain, blood in stool, constipation, diarrhea, nausea and vomiting.  Genitourinary:  Negative for dysuria and hematuria.  Musculoskeletal:  Negative for myalgias.  Skin:  Negative for itching and rash.  Neurological:  Negative for dizziness and headaches.  Psychiatric/Behavioral:  Negative for depression and suicidal ideas.      Objective:     BP 134/72   Pulse (!) 101   Ht 5\' 11"  (1.803 m)   Wt 228 lb 12.8 oz (103.8 kg)   SpO2 97%   BMI 31.91 kg/m  BP Readings from Last 3 Encounters:  06/07/22 134/72  02/08/22 (!) 142/93  01/12/22 (!) 142/92   Physical Exam Vitals reviewed.  Constitutional:      General: He is not in acute distress.    Appearance: Normal appearance. He is obese. He is not  ill-appearing.  HENT:     Head: Normocephalic and atraumatic.     Right Ear: External ear normal.     Left Ear: External ear normal.     Nose: Nose normal. No congestion or rhinorrhea.     Mouth/Throat:     Mouth: Mucous membranes are moist.     Pharynx: Oropharynx is clear.  Eyes:     Extraocular Movements: Extraocular movements intact.     Conjunctiva/sclera: Conjunctivae normal.     Pupils: Pupils are equal, round, and reactive to light.  Cardiovascular:     Rate and Rhythm: Normal rate and regular rhythm.     Pulses: Normal pulses.     Heart sounds: Normal heart sounds. No murmur heard. Pulmonary:     Effort: Pulmonary effort is normal.     Breath sounds: Normal breath sounds. No wheezing, rhonchi or rales.  Abdominal:     General: Abdomen is flat. Bowel sounds are normal. There is no distension.     Palpations: Abdomen is soft.     Tenderness: There is no abdominal tenderness.  Musculoskeletal:        General: No swelling or deformity. Normal range of motion.     Cervical back: Normal range of motion.  Skin:    General: Skin is warm and dry.     Capillary Refill: Capillary  refill takes less than 2 seconds.  Neurological:     General: No focal deficit present.     Mental Status: He is alert and oriented to person, place, and time.     Motor: No weakness.  Psychiatric:        Mood and Affect: Mood normal.        Behavior: Behavior normal.        Thought Content: Thought content normal.   Last CBC Lab Results  Component Value Date   WBC 8.0 02/08/2022   HGB 15.1 02/08/2022   HCT 43.8 02/08/2022   MCV 94 02/08/2022   MCH 32.3 02/08/2022   RDW 13.7 02/08/2022   PLT 256 [id number]   Last metabolic panel Lab Results  Component Value Date   GLUCOSE 96 02/08/2022   NA 138 02/08/2022   K 4.1 02/08/2022   CL 100 02/08/2022   CO2 23 02/08/2022   BUN 11 02/08/2022   CREATININE 0.87 02/08/2022   EGFR 115 02/08/2022   CALCIUM 9.7 02/08/2022   PROT 7.0 02/08/2022    ALBUMIN 4.8 02/08/2022   LABGLOB 2.2 02/08/2022   AGRATIO 2.2 02/08/2022   BILITOT 0.3 02/08/2022   ALKPHOS 48 02/08/2022   AST 27 02/08/2022   ALT 37 02/08/2022   Last lipids Lab Results  Component Value Date   CHOL 189 02/08/2022   HDL 45 02/08/2022   LDLCALC 112 (H) 02/08/2022   TRIG 183 (H) 02/08/2022   CHOLHDL 4.2 02/08/2022   Last hemoglobin A1c Lab Results  Component Value Date   HGBA1C 5.7 (H) 02/08/2022     Assessment & Plan:   Problem List Items Addressed This Visit       Essential hypertension - Primary    Currently prescribed lisinopril 20 mg daily.  His blood pressure today is 134/72.  He has been checking his blood pressure regularly at home and reports readings consistently 120s over 80s. -No medication changes today      Tobacco use    Continues to smoke 3-4 black and mild cigars daily and remains precontemplative with regards to cessation. -The patient was counseled on the dangers of tobacco use, and was advised to quit and reluctant to quit.  Reviewed strategies to maximize success, including removing cigarettes and smoking materials from environment, stress management, substitution of other forms of reinforcement, support of family/friends, and written materials.       Hyperlipidemia    Lipid panel updated in November 2023.  Total cholesterol 189 and LDL 112. -Counseled on lifestyle modifications aimed at weight loss and improving his cholesterol panel.  Mediterranean diet recommended.      Prediabetes    A1c 5.7 in November 2023.  He has been counseled on lifestyle modifications aimed at weight loss and lowering his average blood sugar.      Return in about 6 months (around 12/08/2022).   [name], MD

## 2022-07-25 IMAGING — DX DG SHOULDER 2+V*R*
3 series · 3 of 3 positions shown · non-contrast
Comparison: Chest radiographs 06/22/2017.

CLINICAL DATA: 35-year-old female status post fall landing on right
shoulder with collarbone deformity.

EXAM:
RIGHT SHOULDER - 2+ VIEW

[shoulder axial]
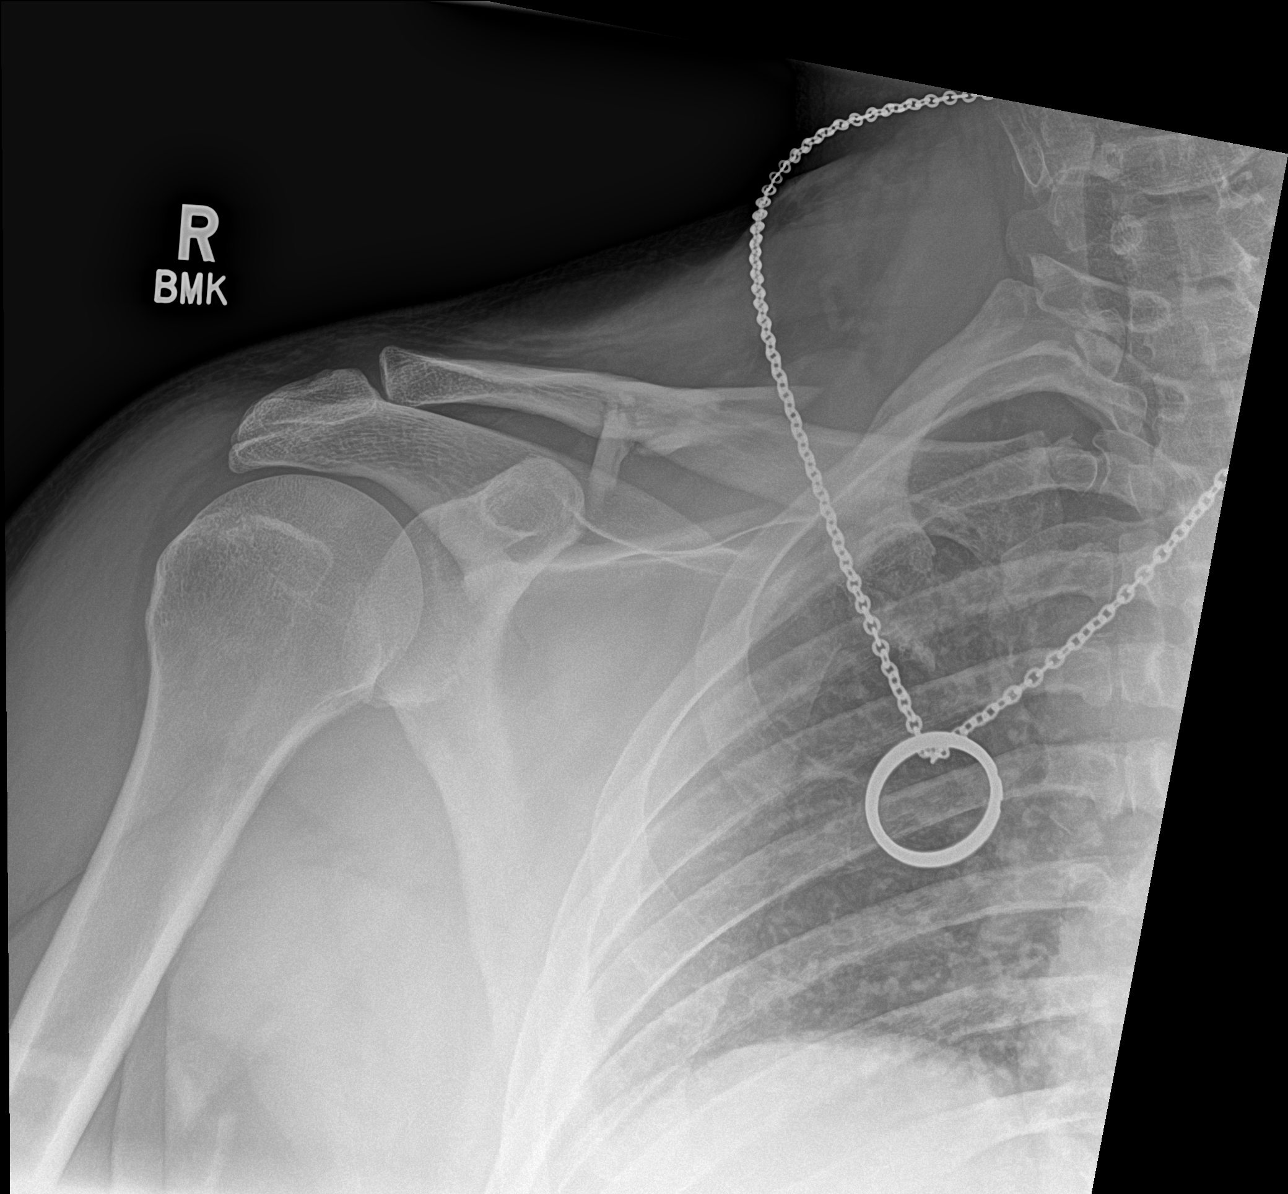

[shoulder ap]
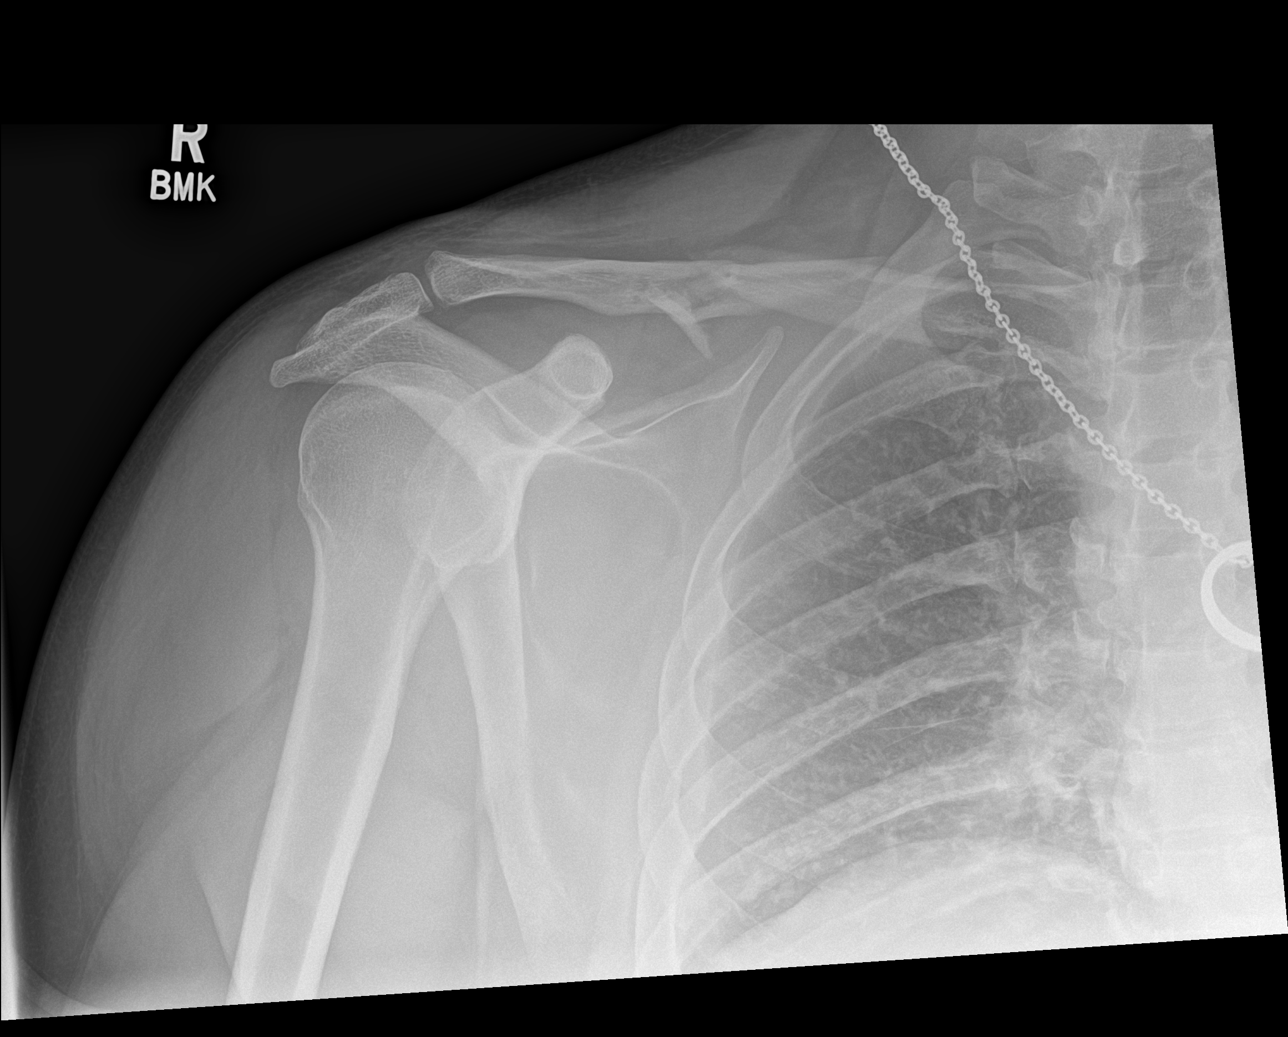

[shoulder obl]
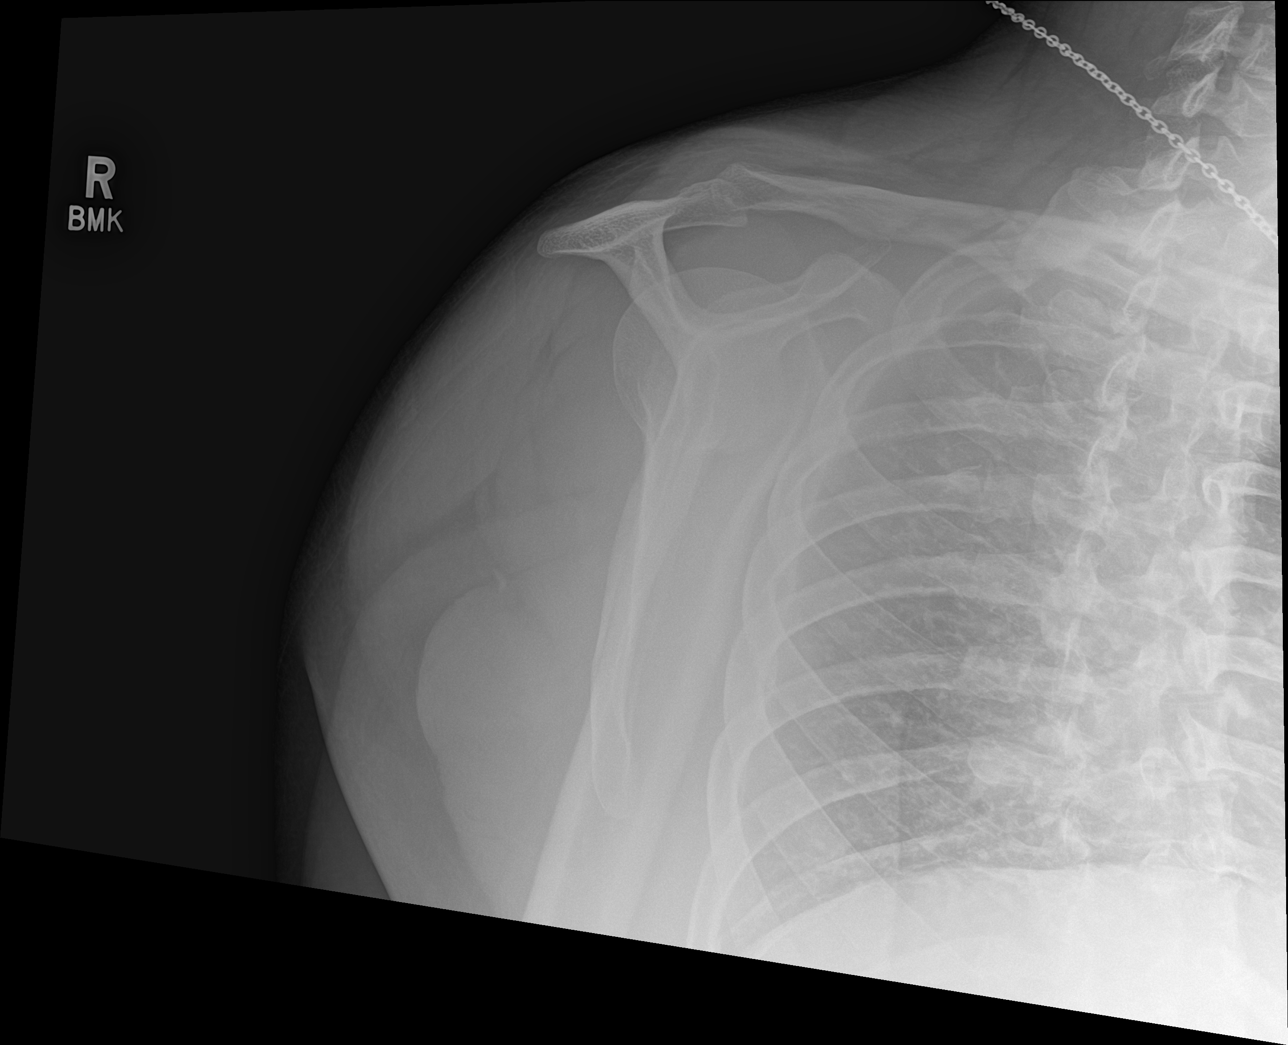

[3 of 3 positions shown; findings below may reference images not displayed]

FINDINGS: Comminuted right mid clavicular shaft fracture with an steeply
inferiorly angulated 2.4 cm butterfly fragment, larger but more
mildly angulated 4.2 cm superior butterfly fragment. But no
significant distal fragment displacement or angulation. Right AC
joint appears to remain intact. No glenohumeral joint dislocation.
Proximal right humerus and right scapula appear intact. Negative
visible right ribs and chest.
IMPRESSION: Comminuted right clavicle midshaft fracture with displaced,
angulated 2 cm and 4 cm butterfly fragments.

## 2022-09-18 ENCOUNTER — Other Ambulatory Visit: Payer: Self-pay

## 2022-09-18 ENCOUNTER — Telehealth: Payer: Self-pay | Admitting: Internal Medicine

## 2022-09-18 DIAGNOSIS — I10 Essential (primary) hypertension: Secondary | ICD-10-CM

## 2022-09-18 MED ORDER — LISINOPRIL 20 MG PO TABS: 20.0000 mg | ORAL_TABLET | Freq: Every day | ORAL | 1 refills | Status: DC

## 2022-09-18 NOTE — Telephone Encounter (Signed)
Refills sent

## 2022-09-18 NOTE — Telephone Encounter (Signed)
Prescription Request  09/18/2022  LOV: 06/07/2022  What is the name of the medication or equipment? lisinopril (ZESTRIL) 20 MG tablet [[id number]]  E   Have you contacted your pharmacy to request a refill? Yes   Which pharmacy would you like this sent to?  [facility] [address] [place] - [address] [place] [address] Phone: [phone] Fax: [phone]    Patient notified that their request is being sent to the clinical staff for review and that they should receive a response within 2 business days.   Please advise at Mobile [phone] (mobile)

## 2022-12-08 ENCOUNTER — Ambulatory Visit: Payer: BC Managed Care – PPO | Admitting: Internal Medicine

## 2022-12-08 ENCOUNTER — Encounter: Payer: Self-pay | Admitting: Internal Medicine

## 2022-12-08 VITALS — BP 130/78 | HR 90 | Resp 15 | Ht 71.0 in | Wt 235.0 lb

## 2022-12-08 DIAGNOSIS — E782 Mixed hyperlipidemia: Secondary | ICD-10-CM

## 2022-12-08 DIAGNOSIS — Z2821 Immunization not carried out because of patient refusal: Secondary | ICD-10-CM | POA: Diagnosis not present

## 2022-12-08 DIAGNOSIS — R7303 Prediabetes: Secondary | ICD-10-CM | POA: Diagnosis not present

## 2022-12-08 DIAGNOSIS — I10 Essential (primary) hypertension: Secondary | ICD-10-CM

## 2022-12-08 DIAGNOSIS — Z720 Tobacco use: Secondary | ICD-10-CM | POA: Diagnosis not present

## 2022-12-08 MED ORDER — LISINOPRIL 20 MG PO TABS: 20.0000 mg | ORAL_TABLET | Freq: Every day | ORAL | 3 refills | Status: DC

## 2022-12-08 NOTE — Assessment & Plan Note (Signed)
A1c 5.7 in November 2023.  Lifestyle modifications aimed at lowering his average blood sugar were reviewed again today.

## 2022-12-08 NOTE — Assessment & Plan Note (Signed)
Remains adequately controlled with lisinopril 20 mg daily.  No medication changes are indicated today.

## 2022-12-08 NOTE — Assessment & Plan Note (Signed)
Continues to smoke 3-4 black and mild cigars daily and remains precontemplative with regards to cessation.

## 2022-12-08 NOTE — Progress Notes (Signed)
Established Patient Office Visit  Subjective   Patient ID: Francis Clay, male    DOB: June 16, 1985  Age: 37 y.o. MRN: [id number]  Chief Complaint  Patient presents with   Hypertension    6 month follow up. Needs refill of med if no change   Francis Clay returns to care today for routine follow-up.  He was last evaluated by me on 3/20.  No medication changes were made at that time and 54-month follow-up was arranged.  There have been no acute interval events.  Francis Clay reports feeling well today.  He is asymptomatic and has no acute concerns to discuss.  History reviewed. No pertinent past medical history. Past Surgical History:  Procedure Laterality Date   SKULL FRACTURE ELEVATION     when pt was kid    Social History   Tobacco Use   Smoking status: Every Day    Types: Cigars   Smokeless tobacco: Never  Vaping Use   Vaping status: Former  Substance Use Topics   Alcohol use: Yes   Drug use: Not Currently   History reviewed. No pertinent family history. No Known Allergies  Review of Systems  Constitutional:  Negative for chills and fever.  HENT:  Negative for sore throat.   Respiratory:  Negative for cough and shortness of breath.   Cardiovascular:  Negative for chest pain, palpitations and leg swelling.  Gastrointestinal:  Negative for abdominal pain, blood in stool, constipation, diarrhea, nausea and vomiting.  Genitourinary:  Negative for dysuria and hematuria.  Musculoskeletal:  Negative for myalgias.  Skin:  Negative for itching and rash.  Neurological:  Negative for dizziness and headaches.  Psychiatric/Behavioral:  Negative for depression and suicidal ideas.      Objective:     BP 130/78   Pulse 90   Resp 15   Ht 5\' 11"  (1.803 m)   Wt 235 lb (106.6 kg)   SpO2 98%   BMI 32.78 kg/m  BP Readings from Last 3 Encounters:  12/08/22 130/78  06/07/22 134/72  02/08/22 (!) 142/93   Physical Exam Vitals reviewed.  Constitutional:      General: He is not in  acute distress.    Appearance: Normal appearance. He is obese. He is not ill-appearing.  HENT:     Head: Normocephalic and atraumatic.     Right Ear: External ear normal.     Left Ear: External ear normal.     Nose: Nose normal. No congestion or rhinorrhea.     Mouth/Throat:     Mouth: Mucous membranes are moist.     Pharynx: Oropharynx is clear.  Eyes:     General: No scleral icterus.    Extraocular Movements: Extraocular movements intact.     Conjunctiva/sclera: Conjunctivae normal.     Pupils: Pupils are equal, round, and reactive to light.  Cardiovascular:     Rate and Rhythm: Normal rate and regular rhythm.     Pulses: Normal pulses.     Heart sounds: Normal heart sounds. No murmur heard. Pulmonary:     Effort: Pulmonary effort is normal.     Breath sounds: Normal breath sounds. No wheezing, rhonchi or rales.  Abdominal:     General: Abdomen is flat. Bowel sounds are normal. There is no distension.     Palpations: Abdomen is soft.     Tenderness: There is no abdominal tenderness.  Musculoskeletal:        General: No swelling or deformity. Normal range of motion.  Cervical back: Normal range of motion.  Skin:    General: Skin is warm and dry.     Capillary Refill: Capillary refill takes less than 2 seconds.  Neurological:     General: No focal deficit present.     Mental Status: He is alert and oriented to person, place, and time.     Motor: No weakness.  Psychiatric:        Mood and Affect: Mood normal.        Behavior: Behavior normal.        Thought Content: Thought content normal.   Last CBC Lab Results  Component Value Date   WBC 8.0 02/08/2022   HGB 15.1 02/08/2022   HCT 43.8 02/08/2022   MCV 94 02/08/2022   MCH 32.3 02/08/2022   RDW 13.7 02/08/2022   PLT 256 02/08/2022   Last metabolic panel Lab Results  Component Value Date   GLUCOSE 96 02/08/2022   NA 138 02/08/2022   K 4.1 02/08/2022   CL 100 02/08/2022   CO2 23 02/08/2022   BUN 11  02/08/2022   CREATININE 0.87 02/08/2022   EGFR 115 02/08/2022   CALCIUM 9.7 02/08/2022   PROT 7.0 02/08/2022   ALBUMIN 4.8 02/08/2022   LABGLOB 2.2 02/08/2022   AGRATIO 2.2 02/08/2022   BILITOT 0.3 02/08/2022   ALKPHOS 48 02/08/2022   AST 27 02/08/2022   ALT 37 02/08/2022   Last lipids Lab Results  Component Value Date   CHOL 189 02/08/2022   HDL 45 02/08/2022   LDLCALC 112 (H) 02/08/2022   TRIG 183 (H) 02/08/2022   CHOLHDL 4.2 02/08/2022   Last hemoglobin A1c Lab Results  Component Value Date   HGBA1C 5.7 (H) 02/08/2022     Assessment & Plan:   Problem List Items Addressed This Visit       Essential hypertension - Primary    Remains adequately controlled with lisinopril 20 mg daily.  No medication changes are indicated today.      Tobacco use    Continues to smoke 3-4 black and mild cigars daily and remains precontemplative with regards to cessation.      Hyperlipidemia    Lipid panel updated in November 2023.  Total cholesterol 189 and LDL 112.  Lifestyle modifications aimed at lowering his cholesterol were reviewed again today.      Prediabetes    A1c 5.7 in November 2023.  Lifestyle modifications aimed at lowering his average blood sugar were reviewed again today.      Return in about 6 months (around 06/07/2023).   [name], MD

## 2022-12-08 NOTE — Assessment & Plan Note (Signed)
Lipid panel updated in November 2023.  Total cholesterol 189 and LDL 112.  Lifestyle modifications aimed at lowering his cholesterol were reviewed again today.

## 2022-12-08 NOTE — Patient Instructions (Signed)
It was a pleasure to see you today.  Thank you for giving [place] the opportunity to be involved in your care.  Below is a brief recap of your visit and next steps.  We will plan to see you again in 6 months.  Summary No medication changes today. Lisinopril refilled.  We will plan for follow up in 6 months

## 2023-06-07 ENCOUNTER — Ambulatory Visit: Payer: BC Managed Care – PPO | Admitting: Internal Medicine

## 2023-06-07 ENCOUNTER — Encounter: Payer: Self-pay | Admitting: Internal Medicine

## 2023-06-07 VITALS — BP 133/88 | HR 90 | Ht 71.0 in | Wt 241.4 lb

## 2023-06-07 DIAGNOSIS — I10 Essential (primary) hypertension: Secondary | ICD-10-CM | POA: Diagnosis not present

## 2023-06-07 DIAGNOSIS — E782 Mixed hyperlipidemia: Secondary | ICD-10-CM

## 2023-06-07 DIAGNOSIS — R7303 Prediabetes: Secondary | ICD-10-CM | POA: Diagnosis not present

## 2023-06-07 DIAGNOSIS — E66811 Obesity, class 1: Secondary | ICD-10-CM

## 2023-06-07 DIAGNOSIS — Z720 Tobacco use: Secondary | ICD-10-CM

## 2023-06-07 NOTE — Progress Notes (Unsigned)
 Established Patient Office Visit  Subjective   Patient ID: Francis Clay, male    DOB: [birth year]  Age: 38 y.o. MRN: [id number]  Chief Complaint  Patient presents with   Hypertension    Six month follow up    Mr. Eves returns to care today for routine follow-up.  He was last evaluated by me in September 2024.  No medication changes were made and 42-month follow-up was arranged.  There have been no acute interval events.  Mr. Rondon reports feeling well today.  He is asymptomatic and has no acute concerns to discuss.  History reviewed. No pertinent past medical history. Past Surgical History:  Procedure Laterality Date   SKULL FRACTURE ELEVATION     when pt was kid    Social History   Tobacco Use   Smoking status: Every Day    Types: Cigars   Smokeless tobacco: Never  Vaping Use   Vaping status: Former  Substance Use Topics   Alcohol use: Yes   Drug use: Not Currently   History reviewed. No pertinent family history. No Known Allergies  Review of Systems  Constitutional:  Negative for chills and fever.  HENT:  Negative for sore throat.   Respiratory:  Negative for cough and shortness of breath.   Cardiovascular:  Negative for chest pain, palpitations and leg swelling.  Gastrointestinal:  Negative for abdominal pain, blood in stool, constipation, diarrhea, nausea and vomiting.  Genitourinary:  Negative for dysuria and hematuria.  Musculoskeletal:  Negative for myalgias.  Skin:  Negative for itching and rash.  Neurological:  Negative for dizziness and headaches.  Psychiatric/Behavioral:  Negative for depression and suicidal ideas.      Objective:     BP 133/88 (BP Location: Left Arm, Patient Position: Sitting, Cuff Size: Large)   Pulse 90   Ht 5\' 11"  (1.803 m)   Wt 241 lb 6.4 oz (109.5 kg)   SpO2 95%   BMI 33.67 kg/m  BP Readings from Last 3 Encounters:  06/07/23 133/88  12/08/22 130/78  06/07/22 134/72   Physical Exam Vitals reviewed.  Constitutional:       General: He is not in acute distress.    Appearance: Normal appearance. He is obese. He is not ill-appearing.  HENT:     Head: Normocephalic and atraumatic.     Right Ear: External ear normal.     Left Ear: External ear normal.     Nose: Nose normal. No congestion or rhinorrhea.     Mouth/Throat:     Mouth: Mucous membranes are moist.     Pharynx: Oropharynx is clear.  Eyes:     General: No scleral icterus.    Extraocular Movements: Extraocular movements intact.     Conjunctiva/sclera: Conjunctivae normal.     Pupils: Pupils are equal, round, and reactive to light.  Cardiovascular:     Rate and Rhythm: Normal rate and regular rhythm.     Pulses: Normal pulses.     Heart sounds: Normal heart sounds. No murmur heard. Pulmonary:     Effort: Pulmonary effort is normal.     Breath sounds: Normal breath sounds. No wheezing, rhonchi or rales.  Abdominal:     General: Abdomen is flat. Bowel sounds are normal. There is no distension.     Palpations: Abdomen is soft.     Tenderness: There is no abdominal tenderness.  Musculoskeletal:        General: No swelling or deformity. Normal range of motion.  Cervical back: Normal range of motion.  Skin:    General: Skin is warm and dry.     Capillary Refill: Capillary refill takes less than 2 seconds.  Neurological:     General: No focal deficit present.     Mental Status: He is alert and oriented to person, place, and time.     Motor: No weakness.  Psychiatric:        Mood and Affect: Mood normal.        Behavior: Behavior normal.        Thought Content: Thought content normal.   Last CBC Lab Results  Component Value Date   WBC 8.0 02/08/2022   HGB 15.1 02/08/2022   HCT 43.8 02/08/2022   MCV 94 02/08/2022   MCH 32.3 02/08/2022   RDW 13.7 02/08/2022   PLT 256 02/08/2022   Last metabolic panel Lab Results  Component Value Date   GLUCOSE 96 02/08/2022   NA 138 02/08/2022   K 4.1 02/08/2022   CL 100 02/08/2022   CO2 23  02/08/2022   BUN 11 02/08/2022   CREATININE 0.87 02/08/2022   EGFR 115 02/08/2022   CALCIUM 9.7 02/08/2022   PROT 7.0 02/08/2022   ALBUMIN 4.8 02/08/2022   LABGLOB 2.2 02/08/2022   AGRATIO 2.2 02/08/2022   BILITOT 0.3 02/08/2022   ALKPHOS 48 02/08/2022   AST 27 02/08/2022   ALT 37 02/08/2022   Last lipids Lab Results  Component Value Date   CHOL 189 02/08/2022   HDL 45 02/08/2022   LDLCALC 112 (H) 02/08/2022   TRIG 183 (H) 02/08/2022   CHOLHDL 4.2 02/08/2022   Last hemoglobin A1c Lab Results  Component Value Date   HGBA1C 5.7 (H) 02/08/2022     Assessment & Plan:   Problem List Items Addressed This Visit       Essential hypertension - Primary   Remains adequately controlled with lisinopril 20 mg daily.  No medication changes are indicated today.      Tobacco use   He continues to smoke 5-6 black and mild cigars daily and remains precontemplative with regards to cessation. -The patient was counseled on the dangers of tobacco use, and was advised to quit and reluctant to quit.  Reviewed strategies to maximize success, including removing cigarettes and smoking materials from environment, stress management, substitution of other forms of reinforcement, support of family/friends, and written materials.       Hyperlipidemia   Lipid panel last updated in November 2023.  Total cholesterol 189 and LDL 112.  He has focused on dietary modifications aimed at improving his cholesterol.  Repeat lipid panel ordered today      Prediabetes   A1c 5.7 on labs from November 2023.  Repeat A1c ordered today.      Return in about 6 months (around 12/08/2023).   [name], MD

## 2023-06-07 NOTE — Patient Instructions (Signed)
 It was a pleasure to see you today.  Thank you for giving [place] the opportunity to be involved in your care.  Below is a brief recap of your visit and next steps.  We will plan to see you again in 6 months.  Summary No medication changes today Repeat labs ordered Follow up in 6 months

## 2023-06-08 ENCOUNTER — Other Ambulatory Visit: Payer: Self-pay | Admitting: Internal Medicine

## 2023-06-08 ENCOUNTER — Encounter: Payer: Self-pay | Admitting: Internal Medicine

## 2023-06-08 DIAGNOSIS — E559 Vitamin D deficiency, unspecified: Secondary | ICD-10-CM

## 2023-06-08 MED ORDER — VITAMIN D (ERGOCALCIFEROL) 1.25 MG (50000 UNIT) PO CAPS
50000.0000 [IU] | ORAL_CAPSULE | ORAL | 0 refills | Status: AC
Start: 2023-06-08 — End: 2023-08-25

## 2023-06-08 NOTE — Assessment & Plan Note (Signed)
 He continues to smoke 5-6 black and mild cigars daily and remains precontemplative with regards to cessation. -The patient was counseled on the dangers of tobacco use, and was advised to quit and reluctant to quit.  Reviewed strategies to maximize success, including removing cigarettes and smoking materials from environment, stress management, substitution of other forms of reinforcement, support of family/friends, and written materials.

## 2023-06-08 NOTE — Assessment & Plan Note (Signed)
 A1c 5.7 on labs from November 2023.  Repeat A1c ordered today.

## 2023-06-08 NOTE — Assessment & Plan Note (Signed)
 Lipid panel last updated in November 2023.  Total cholesterol 189 and LDL 112.  He has focused on dietary modifications aimed at improving his cholesterol.  Repeat lipid panel ordered today

## 2023-06-08 NOTE — Assessment & Plan Note (Signed)
Remains adequately controlled with lisinopril 20 mg daily.  No medication changes are indicated today.

## 2023-06-09 LAB — LIPID PANEL
Chol/HDL Ratio: 5.2 ratio — ABNORMAL HIGH (ref 0.0–5.0)
Cholesterol, Total: 206 mg/dL — ABNORMAL HIGH (ref 100–199)
HDL: 40 mg/dL (ref >39)
LDL Chol Calc (NIH): 136 mg/dL — ABNORMAL HIGH (ref 0–99)
Triglycerides: 168 mg/dL — ABNORMAL HIGH (ref 0–149)
VLDL Cholesterol Cal: 30 mg/dL (ref 5–40)

## 2023-06-09 LAB — CBC WITH DIFFERENTIAL/PLATELET
Basophils Absolute: 0.1 10*3/uL (ref 0.0–0.2)
Basos: 1 %
EOS (ABSOLUTE): 0.2 10*3/uL (ref 0.0–0.4)
Eos: 2 %
Hematocrit: 45.7 % (ref 37.5–51.0)
Hemoglobin: 15.6 g/dL (ref 13.0–17.7)
Immature Grans (Abs): 0 10*3/uL (ref 0.0–0.1)
Immature Granulocytes: 0 %
Lymphocytes Absolute: 2.8 10*3/uL (ref 0.7–3.1)
Lymphs: 33 %
MCH: 31.3 pg (ref 26.6–33.0)
MCHC: 34.1 g/dL (ref 31.5–35.7)
MCV: 92 fL (ref 79–97)
Monocytes Absolute: 1 10*3/uL — ABNORMAL HIGH (ref 0.1–0.9)
Monocytes: 12 %
Neutrophils Absolute: 4.5 10*3/uL (ref 1.4–7.0)
Neutrophils: 52 %
Platelets: 268 10*3/uL (ref 150–450)
RBC: 4.98 x10E6/uL (ref 4.14–5.80)
RDW: 12.5 % (ref 11.6–15.4)
WBC: 8.6 10*3/uL (ref 3.4–10.8)

## 2023-06-09 LAB — CMP14+EGFR
ALT: 37 IU/L (ref 0–44)
AST: 25 IU/L (ref 0–40)
Albumin: 4.7 g/dL (ref 4.1–5.1)
Alkaline Phosphatase: 55 IU/L (ref 44–121)
BUN/Creatinine Ratio: 8 — ABNORMAL LOW (ref 9–20)
BUN: 7 mg/dL (ref 6–20)
Bilirubin Total: 0.2 mg/dL (ref 0.0–1.2)
CO2: 22 mmol/L (ref 20–29)
Calcium: 9.6 mg/dL (ref 8.7–10.2)
Chloride: 101 mmol/L (ref 96–106)
Creatinine, Ser: 0.86 mg/dL (ref 0.76–1.27)
Globulin, Total: 2.3 g/dL (ref 1.5–4.5)
Glucose: 93 mg/dL (ref 70–99)
Potassium: 4.2 mmol/L (ref 3.5–5.2)
Sodium: 139 mmol/L (ref 134–144)
Total Protein: 7 g/dL (ref 6.0–8.5)
eGFR: 115 mL/min/{1.73_m2} (ref >59)

## 2023-06-09 LAB — B12 AND FOLATE PANEL
Folate: 12.5 ng/mL (ref >3.0)
Vitamin B-12: 265 pg/mL (ref 232–1245)

## 2023-06-09 LAB — HEMOGLOBIN A1C
Est. average glucose Bld gHb Est-mCnc: 120 mg/dL
Hgb A1c MFr Bld: 5.8 % — ABNORMAL HIGH (ref 4.8–5.6)

## 2023-06-09 LAB — TSH+FREE T4
Free T4: 1.27 ng/dL (ref 0.82–1.77)
TSH: 1.04 u[IU]/mL (ref 0.450–4.500)

## 2023-06-09 LAB — VITAMIN D 25 HYDROXY (VIT D DEFICIENCY, FRACTURES): Vit D, 25-Hydroxy: 10.2 ng/mL — ABNORMAL LOW (ref 30.0–100.0)

## 2023-12-07 ENCOUNTER — Ambulatory Visit

## 2023-12-07 VITALS — BP 122/90 | HR 94 | Resp 16 | Ht 71.0 in | Wt 242.1 lb

## 2023-12-07 DIAGNOSIS — I10 Essential (primary) hypertension: Secondary | ICD-10-CM | POA: Diagnosis not present

## 2023-12-07 DIAGNOSIS — E782 Mixed hyperlipidemia: Secondary | ICD-10-CM

## 2023-12-07 DIAGNOSIS — R7303 Prediabetes: Secondary | ICD-10-CM

## 2023-12-07 MED ORDER — LISINOPRIL 20 MG PO TABS: 20.0000 mg | ORAL_TABLET | Freq: Every day | ORAL | 2 refills | Status: AC

## 2023-12-07 NOTE — Progress Notes (Signed)
 Established Patient Office Visit  Subjective   Patient ID: Francis Clay, male    DOB: 01-25-1986  Age: 38 y.o. MRN: [id number]  Chief Complaint  Patient presents with   Medical Management of Chronic Issues    6 month follow up     HPI Discussed the use of AI scribe software for clinical note transcription with the patient, who gave verbal consent to proceed.  History of Present Illness   Francis Clay is a 38 year old male with hypertension and prediabetes who presents for medication refills.  Hypertension - Variable blood pressure readings at home, with occasional elevations up to 180/unknown - Blood pressure tends to decrease after resting - Uses a wristband device for blood pressure measurement and questions its accuracy - Currently taking antihypertensive medication at a dose of 20 mg, stable for an extended period  Prediabetes - Diagnosed with prediabetes six months ago - No recent laboratory work completed - Uncertainty regarding insurance coverage for laboratory tests and annual physical examination due to previous billing issues  Tobacco use - Smokes cigars, typically finishing one just before entering his home - Does not smoke inside his house - Attributes his cough to cigar smoking  Nutritional intake - Ate a salad around 2 PM on the day of the visit - Consumed a couple of sips of water prior to the appointment      Patient Active Problem List   Diagnosis Date Noted   Hyperlipidemia 06/07/2022   Prediabetes 06/07/2022   Need for Tdap vaccination 02/08/2022   Essential hypertension 01/18/2022   OSA (obstructive sleep apnea) 01/18/2022   Encounter for well adult exam with abnormal findings 01/18/2022   Tobacco use 01/18/2022   Alcohol use 01/18/2022    ROS    Objective:     BP (!) 122/90 (BP Location: Left Arm, Patient Position: Sitting, Cuff Size: Normal)   Pulse 94   Resp 16   Ht 5' 11 (1.803 m)   Wt 242 lb 1.3 oz (109.8 kg)   SpO2 96%    BMI 33.76 kg/m  BP Readings from Last 3 Encounters:  12/07/23 (!) 122/90  06/07/23 133/88  12/08/22 130/78   Wt Readings from Last 3 Encounters:  12/07/23 242 lb 1.3 oz (109.8 kg)  06/07/23 241 lb 6.4 oz (109.5 kg)  12/08/22 235 lb (106.6 kg)     Physical Exam Vitals and nursing note reviewed.  Constitutional:      Appearance: Normal appearance. He is obese.  HENT:     Head: Normocephalic.  Eyes:     Extraocular Movements: Extraocular movements intact.     Pupils: Pupils are equal, round, and reactive to light.  Cardiovascular:     Rate and Rhythm: Normal rate and regular rhythm.  Pulmonary:     Effort: Pulmonary effort is normal.     Breath sounds: Normal breath sounds.  Musculoskeletal:     Cervical back: Normal range of motion and neck supple.  Neurological:     Mental Status: He is alert and oriented to person, place, and time.  Psychiatric:        Mood and Affect: Mood normal.        Thought Content: Thought content normal.      No results found for any visits on 12/07/23.    The ASCVD Risk score (Arnett DK, et al., 2019) failed to calculate for the following reasons:   The 2019 ASCVD risk score is only valid for ages 49 to  79    Assessment & Plan:   Problem List Items Addressed This Visit       Cardiovascular and Mediastinum   Essential hypertension - Primary   Blood pressure variable, elevated in clinic. Home readings high, decrease after rest. Smoking may contribute. - Continue antihypertensive medication at 20 mg. - Advise home blood pressure monitoring, rest before readings. - Discuss smoking impact on blood pressure.      Relevant Medications   lisinopril  (ZESTRIL ) 20 MG tablet     Other   Hyperlipidemia   Relevant Medications   lisinopril  (ZESTRIL ) 20 MG tablet   Prediabetes   Diagnosed six months ago.  - Plan POC A1c test next visit.          Return in about 6 months (around 06/05/2024) for chronic follow-up with PCP.    Leita Longs, FNP

## 2023-12-11 NOTE — Assessment & Plan Note (Signed)
 Diagnosed six months ago.  - Plan POC A1c test next visit.

## 2023-12-11 NOTE — Assessment & Plan Note (Signed)
 Blood pressure variable, elevated in clinic. Home readings high, decrease after rest. Smoking may contribute. - Continue antihypertensive medication at 20 mg. - Advise home blood pressure monitoring, rest before readings. - Discuss smoking impact on blood pressure.

## 2024-06-06 ENCOUNTER — Ambulatory Visit
